# Patient Record
Sex: Male | Born: 1965 | Race: Black or African American | Hispanic: No | Marital: Single | Smoking: Current every day smoker
Health system: Southern US, Community
[De-identification: ages and names within clinical notes are randomized; demographics above are authoritative.]

## PROBLEM LIST (undated history)

## (undated) DIAGNOSIS — F39 Unspecified mood [affective] disorder: Secondary | ICD-10-CM

## (undated) DIAGNOSIS — M249 Joint derangement, unspecified: Secondary | ICD-10-CM

## (undated) DIAGNOSIS — I10 Essential (primary) hypertension: Secondary | ICD-10-CM

## (undated) HISTORY — PX: ABDOMINAL DEBRIDEMENT: SHX1109

---

## 2017-05-23 ENCOUNTER — Other Ambulatory Visit: Payer: Self-pay

## 2017-05-23 ENCOUNTER — Encounter (HOSPITAL_COMMUNITY): Payer: Self-pay | Admitting: Emergency Medicine

## 2017-05-23 ENCOUNTER — Ambulatory Visit (HOSPITAL_COMMUNITY)
Admission: EM | Admit: 2017-05-23 | Discharge: 2017-05-23 | Disposition: A | Discharge status: Home / Self Care | Payer: Medicaid Other

## 2017-05-23 DIAGNOSIS — M5441 Lumbago with sciatica, right side: Secondary | ICD-10-CM

## 2017-05-23 DIAGNOSIS — R1013 Epigastric pain: Secondary | ICD-10-CM | POA: Diagnosis not present

## 2017-05-23 DIAGNOSIS — G8929 Other chronic pain: Secondary | ICD-10-CM

## 2017-05-23 HISTORY — DX: Essential (primary) hypertension: I10

## 2017-05-23 HISTORY — DX: Joint derangement, unspecified: M24.9

## 2017-05-23 HISTORY — DX: Unspecified mood (affective) disorder: F39

## 2017-05-23 MED ORDER — OMEPRAZOLE 40 MG PO CPDR: 40.0000 mg | DELAYED_RELEASE_CAPSULE | Freq: Every day | ORAL | 0 refills | Status: AC

## 2017-05-23 MED ORDER — TIZANIDINE HCL 4 MG PO CAPS: 4.0000 mg | ORAL_CAPSULE | Freq: Three times a day (TID) | ORAL | 0 refills | Status: AC | PRN

## 2017-05-23 MED ORDER — HYOSCYAMINE SULFATE 0.125 MG PO TABS: 0.1250 mg | ORAL_TABLET | Freq: Four times a day (QID) | ORAL | 0 refills | Status: AC | PRN

## 2017-05-23 NOTE — ED Triage Notes (Signed)
C/o upper abdominal pain after eat and have a BM onset over 2 weeks. Denies N/V
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
## 2017-05-23 NOTE — Discharge Instructions (Addendum)
Recommend start Prilosec 40mg  once daily for stomach pain. May also take Levsin 1 tablet every 8 hours as needed for pain or cramps. May also use Zanaflex 4mg  tablet- 1 every 8 hours as needed for back pain/spasms. May also take Tylenol 1000mg  every 8 hours as needed for pain. Avoid fatty, acidic foods. Encouraged to decrease smoking. Recommend follow-up with a PCP within 1 week for recheck or go to the ER if pain worsens.

## 2018-07-07 ENCOUNTER — Encounter (HOSPITAL_COMMUNITY): Payer: Self-pay | Admitting: Emergency Medicine

## 2018-07-07 ENCOUNTER — Emergency Department (HOSPITAL_COMMUNITY)
Admission: EM | Admit: 2018-07-07 | Discharge: 2018-07-07 | Disposition: A | Discharge status: Home / Self Care | Payer: Medicaid Other | Attending: Emergency Medicine | Admitting: Emergency Medicine

## 2018-07-07 ENCOUNTER — Emergency Department (HOSPITAL_COMMUNITY): Payer: Medicaid Other

## 2018-07-07 DIAGNOSIS — S71111A Laceration without foreign body, right thigh, initial encounter: Secondary | ICD-10-CM | POA: Insufficient documentation

## 2018-07-07 DIAGNOSIS — I10 Essential (primary) hypertension: Secondary | ICD-10-CM | POA: Diagnosis not present

## 2018-07-07 DIAGNOSIS — S0231XA Fracture of orbital floor, right side, initial encounter for closed fracture: Secondary | ICD-10-CM | POA: Diagnosis not present

## 2018-07-07 DIAGNOSIS — M549 Dorsalgia, unspecified: Secondary | ICD-10-CM | POA: Insufficient documentation

## 2018-07-07 DIAGNOSIS — Y939 Activity, unspecified: Secondary | ICD-10-CM | POA: Diagnosis not present

## 2018-07-07 DIAGNOSIS — S0181XA Laceration without foreign body of other part of head, initial encounter: Secondary | ICD-10-CM | POA: Insufficient documentation

## 2018-07-07 DIAGNOSIS — H1131 Conjunctival hemorrhage, right eye: Secondary | ICD-10-CM | POA: Insufficient documentation

## 2018-07-07 DIAGNOSIS — Z23 Encounter for immunization: Secondary | ICD-10-CM | POA: Diagnosis not present

## 2018-07-07 DIAGNOSIS — Y929 Unspecified place or not applicable: Secondary | ICD-10-CM | POA: Insufficient documentation

## 2018-07-07 DIAGNOSIS — Y999 Unspecified external cause status: Secondary | ICD-10-CM | POA: Insufficient documentation

## 2018-07-07 DIAGNOSIS — R51 Headache: Secondary | ICD-10-CM | POA: Diagnosis not present

## 2018-07-07 DIAGNOSIS — R1084 Generalized abdominal pain: Secondary | ICD-10-CM | POA: Diagnosis not present

## 2018-07-07 DIAGNOSIS — T07XXXA Unspecified multiple injuries, initial encounter: Secondary | ICD-10-CM | POA: Diagnosis present

## 2018-07-07 DIAGNOSIS — S70311A Abrasion, right thigh, initial encounter: Secondary | ICD-10-CM | POA: Diagnosis not present

## 2018-07-07 DIAGNOSIS — F17210 Nicotine dependence, cigarettes, uncomplicated: Secondary | ICD-10-CM | POA: Insufficient documentation

## 2018-07-07 DIAGNOSIS — R0789 Other chest pain: Secondary | ICD-10-CM | POA: Diagnosis not present

## 2018-07-07 LAB — COMPREHENSIVE METABOLIC PANEL
ALT: 54 U/L — ABNORMAL HIGH (ref 0–44)
AST: 54 U/L — ABNORMAL HIGH (ref 15–41)
Albumin: 3.9 g/dL (ref 3.5–5.0)
Alkaline Phosphatase: 90 U/L (ref 38–126)
Anion gap: 12 (ref 5–15)
BUN: 14 mg/dL (ref 6–20)
CO2: 23 mmol/L (ref 22–32)
Calcium: 9.1 mg/dL (ref 8.9–10.3)
Chloride: 100 mmol/L (ref 98–111)
Creatinine, Ser: 1.24 mg/dL (ref 0.61–1.24)
GFR calc Af Amer: >60 mL/min (ref >60)
GFR calc non Af Amer: >60 mL/min (ref >60)
Glucose, Bld: 113 mg/dL — ABNORMAL HIGH (ref 70–99)
Potassium: 2.9 mmol/L — ABNORMAL LOW (ref 3.5–5.1)
Sodium: 135 mmol/L (ref 135–145)
Total Bilirubin: 0.9 mg/dL (ref 0.3–1.2)
Total Protein: 7.7 g/dL (ref 6.5–8.1)

## 2018-07-07 LAB — CBC WITH DIFFERENTIAL/PLATELET
Abs Immature Granulocytes: 0.08 10*3/uL — ABNORMAL HIGH (ref 0.00–0.07)
Basophils Absolute: 0.1 10*3/uL (ref 0.0–0.1)
Basophils Relative: 1 %
Eosinophils Absolute: 0.1 10*3/uL (ref 0.0–0.5)
Eosinophils Relative: 1 %
HCT: 45.6 % (ref 39.0–52.0)
Hemoglobin: 15.2 g/dL (ref 13.0–17.0)
Immature Granulocytes: 1 %
Lymphocytes Relative: 23 %
Lymphs Abs: 2 10*3/uL (ref 0.7–4.0)
MCH: 29.9 pg (ref 26.0–34.0)
MCHC: 33.3 g/dL (ref 30.0–36.0)
MCV: 89.8 fL (ref 80.0–100.0)
Monocytes Absolute: 0.5 10*3/uL (ref 0.1–1.0)
Monocytes Relative: 6 %
Neutro Abs: 6 10*3/uL (ref 1.7–7.7)
Neutrophils Relative %: 68 %
Platelets: 174 10*3/uL (ref 150–400)
RBC: 5.08 MIL/uL (ref 4.22–5.81)
RDW: 12.9 % (ref 11.5–15.5)
WBC: 8.8 10*3/uL (ref 4.0–10.5)
nRBC: 0 % (ref 0.0–0.2)

## 2018-07-07 LAB — URINALYSIS, ROUTINE W REFLEX MICROSCOPIC
Bacteria, UA: NONE SEEN
Bilirubin Urine: NEGATIVE
Glucose, UA: 50 mg/dL — AB
Ketones, ur: NEGATIVE mg/dL
Leukocytes,Ua: NEGATIVE
Nitrite: NEGATIVE
Protein, ur: 30 mg/dL — AB
Specific Gravity, Urine: 1.017 (ref 1.005–1.030)
pH: 7 (ref 5.0–8.0)

## 2018-07-07 MED ORDER — IOHEXOL 300 MG/ML  SOLN
100.0000 mL | Freq: Once | INTRAMUSCULAR | Status: AC | PRN
  Administered 2018-07-07: 100 mL via INTRAVENOUS

## 2018-07-07 MED ORDER — FLUORESCEIN SODIUM 1 MG OP STRP
1.0000 | ORAL_STRIP | Freq: Once | OPHTHALMIC | Status: DC
  Filled 2018-07-07: qty 1

## 2018-07-07 MED ORDER — TETRACAINE HCL 0.5 % OP SOLN
2.0000 [drp] | Freq: Once | OPHTHALMIC | Status: DC
  Filled 2018-07-07: qty 4

## 2018-07-07 MED ORDER — MORPHINE SULFATE (PF) 4 MG/ML IV SOLN
4.0000 mg | Freq: Once | INTRAVENOUS | Status: AC
  Administered 2018-07-07: 4 mg via INTRAVENOUS
  Filled 2018-07-07: qty 1

## 2018-07-07 MED ORDER — LIDOCAINE-EPINEPHRINE (PF) 2 %-1:200000 IJ SOLN
INTRAMUSCULAR | Status: AC
  Administered 2018-07-07: 20 mL
  Filled 2018-07-07: qty 20

## 2018-07-07 MED ORDER — LIDOCAINE-EPINEPHRINE (PF) 2 %-1:200000 IJ SOLN
20.0000 mL | Freq: Once | INTRAMUSCULAR | Status: AC
  Administered 2018-07-07: 20 mL
  Filled 2018-07-07: qty 20

## 2018-07-07 MED ORDER — TETANUS-DIPHTH-ACELL PERTUSSIS 5-2.5-18.5 LF-MCG/0.5 IM SUSP
0.5000 mL | Freq: Once | INTRAMUSCULAR | Status: AC
Start: 2018-07-07 — End: 2018-07-07
  Administered 2018-07-07: 0.5 mL via INTRAMUSCULAR
  Filled 2018-07-07: qty 0.5

## 2018-07-07 MED ORDER — OXYCODONE-ACETAMINOPHEN 5-325 MG PO TABS
1.0000 | ORAL_TABLET | Freq: Once | ORAL | Status: AC
  Administered 2018-07-07: 1 via ORAL
  Filled 2018-07-07: qty 1

## 2018-07-07 MED ORDER — POTASSIUM CHLORIDE CRYS ER 20 MEQ PO TBCR
40.0000 meq | EXTENDED_RELEASE_TABLET | Freq: Once | ORAL | Status: AC
  Administered 2018-07-07: 40 meq via ORAL
  Filled 2018-07-07: qty 2

## 2018-07-07 MED ORDER — OXYCODONE-ACETAMINOPHEN 5-325 MG PO TABS: 2.0000 | ORAL_TABLET | Freq: Four times a day (QID) | ORAL | 0 refills | Status: AC | PRN
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
## 2018-07-07 NOTE — ED Notes (Signed)
Moved to Room 18, Report given to [place], [place]

## 2018-07-07 NOTE — ED Triage Notes (Signed)
Pt has swollen rt. Eye laceration above rt. Eye. Laceration across left forehead.
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
## 2018-07-07 NOTE — ED Notes (Signed)
Vision acuity test completed: 20/25 booth eyes. 20/50 with right eye(left eye covered). 20/20 with left eye(right eye covered).

## 2018-07-07 NOTE — ED Notes (Signed)
Patient transported to CT 

## 2018-08-09 ENCOUNTER — Encounter (HOSPITAL_COMMUNITY): Payer: Self-pay | Admitting: Emergency Medicine

## 2018-08-09 ENCOUNTER — Other Ambulatory Visit: Payer: Self-pay

## 2018-08-09 ENCOUNTER — Emergency Department (HOSPITAL_COMMUNITY)
Admission: EM | Admit: 2018-08-09 | Discharge: 2018-08-09 | Disposition: A | Discharge status: Home / Self Care | Payer: Medicaid Other | Attending: Emergency Medicine | Admitting: Emergency Medicine

## 2018-08-09 DIAGNOSIS — Z4802 Encounter for removal of sutures: Secondary | ICD-10-CM | POA: Diagnosis present

## 2018-08-09 DIAGNOSIS — Z590 Homelessness: Secondary | ICD-10-CM | POA: Insufficient documentation

## 2018-08-09 DIAGNOSIS — G8921 Chronic pain due to trauma: Secondary | ICD-10-CM | POA: Diagnosis not present

## 2018-08-09 DIAGNOSIS — F1290 Cannabis use, unspecified, uncomplicated: Secondary | ICD-10-CM | POA: Insufficient documentation

## 2018-08-09 DIAGNOSIS — F17210 Nicotine dependence, cigarettes, uncomplicated: Secondary | ICD-10-CM | POA: Diagnosis not present

## 2018-08-09 DIAGNOSIS — F329 Major depressive disorder, single episode, unspecified: Secondary | ICD-10-CM | POA: Diagnosis not present

## 2018-08-09 DIAGNOSIS — Z20828 Contact with and (suspected) exposure to other viral communicable diseases: Secondary | ICD-10-CM | POA: Insufficient documentation

## 2018-08-09 DIAGNOSIS — Z008 Encounter for other general examination: Secondary | ICD-10-CM

## 2018-08-09 LAB — COMPREHENSIVE METABOLIC PANEL
ALT: 47 U/L — ABNORMAL HIGH (ref 0–44)
AST: 42 U/L — ABNORMAL HIGH (ref 15–41)
Albumin: 3.8 g/dL (ref 3.5–5.0)
Alkaline Phosphatase: 91 U/L (ref 38–126)
Anion gap: 11 (ref 5–15)
BUN: 17 mg/dL (ref 6–20)
CO2: 23 mmol/L (ref 22–32)
Calcium: 9.2 mg/dL (ref 8.9–10.3)
Chloride: 105 mmol/L (ref 98–111)
Creatinine, Ser: 1.09 mg/dL (ref 0.61–1.24)
GFR calc Af Amer: >60 mL/min (ref >60)
GFR calc non Af Amer: >60 mL/min (ref >60)
Glucose, Bld: 103 mg/dL — ABNORMAL HIGH (ref 70–99)
Potassium: 3.1 mmol/L — ABNORMAL LOW (ref 3.5–5.1)
Sodium: 139 mmol/L (ref 135–145)
Total Bilirubin: 0.6 mg/dL (ref 0.3–1.2)
Total Protein: 7.8 g/dL (ref 6.5–8.1)

## 2018-08-09 LAB — CBC
HCT: 43.2 % (ref 39.0–52.0)
Hemoglobin: 14.3 g/dL (ref 13.0–17.0)
MCH: 29.9 pg (ref 26.0–34.0)
MCHC: 33.1 g/dL (ref 30.0–36.0)
MCV: 90.2 fL (ref 80.0–100.0)
Platelets: 151 10*3/uL (ref 150–400)
RBC: 4.79 MIL/uL (ref 4.22–5.81)
RDW: 12.9 % (ref 11.5–15.5)
WBC: 6.4 10*3/uL (ref 4.0–10.5)
nRBC: 0 % (ref 0.0–0.2)

## 2018-08-09 LAB — RAPID URINE DRUG SCREEN, HOSP PERFORMED
Amphetamines: NOT DETECTED
Barbiturates: NOT DETECTED
Benzodiazepines: NOT DETECTED
Cocaine: NOT DETECTED
Opiates: NOT DETECTED
Tetrahydrocannabinol: POSITIVE — AB

## 2018-08-09 LAB — ETHANOL: Alcohol, Ethyl (B): <10 mg/dL (ref <10)

## 2018-08-09 LAB — SALICYLATE LEVEL: Salicylate Lvl: <7 mg/dL (ref 2.8–30.0)

## 2018-08-09 LAB — ACETAMINOPHEN LEVEL: Acetaminophen (Tylenol), Serum: <10 ug/mL — ABNORMAL LOW (ref 10–30)

## 2018-08-09 MED ORDER — CLONIDINE HCL 0.2 MG PO TABS
0.1000 mg | ORAL_TABLET | Freq: Once | ORAL | Status: AC
  Administered 2018-08-09: 21:00:00 0.1 mg via ORAL
  Filled 2018-08-09: qty 1

## 2018-08-09 MED ORDER — POTASSIUM CHLORIDE CRYS ER 20 MEQ PO TBCR
20.0000 meq | EXTENDED_RELEASE_TABLET | Freq: Every day | ORAL | Status: DC
  Filled 2018-08-09: qty 1

## 2018-08-09 MED ORDER — ACETAMINOPHEN 325 MG PO TABS
650.0000 mg | ORAL_TABLET | Freq: Once | ORAL | Status: AC
  Administered 2018-08-09: 650 mg via ORAL
  Filled 2018-08-09: qty 2

## 2018-08-09 MED ORDER — POTASSIUM CHLORIDE CRYS ER 20 MEQ PO TBCR
40.0000 meq | EXTENDED_RELEASE_TABLET | Freq: Once | ORAL | Status: AC
  Administered 2018-08-09: 15:00:00 40 meq via ORAL
  Filled 2018-08-09: qty 2
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
## 2018-08-09 NOTE — Progress Notes (Signed)
CSW faxed over information/resources for [facility].   Chalmers Guest. Guerry Bruin, MSW, [place] Work/Disposition Phone: [phone] Fax: [phone]
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
## 2018-08-09 NOTE — ED Notes (Signed)
Personal belongings sheet completed 1 duffle bag placed in locker #2. Patient signed What should I expect sheet.
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
## 2018-08-09 NOTE — ED Notes (Signed)
PA contacted because patient told nurse he feels suicidal now that he is at the breaking point.
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
## 2018-08-10 ENCOUNTER — Other Ambulatory Visit: Payer: Self-pay

## 2018-08-10 ENCOUNTER — Emergency Department (HOSPITAL_COMMUNITY)
Admission: EM | Admit: 2018-08-10 | Discharge: 2018-08-10 | Disposition: A | Discharge status: Home / Self Care | Payer: Medicaid Other | Attending: Emergency Medicine | Admitting: Emergency Medicine

## 2018-08-10 ENCOUNTER — Encounter (HOSPITAL_COMMUNITY): Payer: Self-pay | Admitting: *Deleted

## 2018-08-10 DIAGNOSIS — M545 Low back pain: Secondary | ICD-10-CM | POA: Insufficient documentation

## 2018-08-10 DIAGNOSIS — F329 Major depressive disorder, single episode, unspecified: Secondary | ICD-10-CM | POA: Diagnosis present

## 2018-08-10 DIAGNOSIS — Z008 Encounter for other general examination: Secondary | ICD-10-CM | POA: Diagnosis present

## 2018-08-10 DIAGNOSIS — Z79899 Other long term (current) drug therapy: Secondary | ICD-10-CM | POA: Insufficient documentation

## 2018-08-10 DIAGNOSIS — Z590 Homelessness: Secondary | ICD-10-CM | POA: Insufficient documentation

## 2018-08-10 DIAGNOSIS — Z03818 Encounter for observation for suspected exposure to other biological agents ruled out: Secondary | ICD-10-CM | POA: Diagnosis not present

## 2018-08-10 DIAGNOSIS — F331 Major depressive disorder, recurrent, moderate: Secondary | ICD-10-CM

## 2018-08-10 DIAGNOSIS — F17200 Nicotine dependence, unspecified, uncomplicated: Secondary | ICD-10-CM | POA: Insufficient documentation

## 2018-08-10 DIAGNOSIS — G501 Atypical facial pain: Secondary | ICD-10-CM | POA: Insufficient documentation

## 2018-08-10 DIAGNOSIS — Z9119 Patient's noncompliance with other medical treatment and regimen: Secondary | ICD-10-CM | POA: Insufficient documentation

## 2018-08-10 DIAGNOSIS — R45851 Suicidal ideations: Secondary | ICD-10-CM | POA: Diagnosis not present

## 2018-08-10 DIAGNOSIS — F322 Major depressive disorder, single episode, severe without psychotic features: Secondary | ICD-10-CM | POA: Diagnosis not present

## 2018-08-10 DIAGNOSIS — I10 Essential (primary) hypertension: Secondary | ICD-10-CM | POA: Insufficient documentation

## 2018-08-10 LAB — COMPREHENSIVE METABOLIC PANEL
ALT: 52 U/L — ABNORMAL HIGH (ref 0–44)
AST: 47 U/L — ABNORMAL HIGH (ref 15–41)
Albumin: 4.2 g/dL (ref 3.5–5.0)
Alkaline Phosphatase: 104 U/L (ref 38–126)
Anion gap: 12 (ref 5–15)
BUN: 16 mg/dL (ref 6–20)
CO2: 27 mmol/L (ref 22–32)
Calcium: 9.9 mg/dL (ref 8.9–10.3)
Chloride: 99 mmol/L (ref 98–111)
Creatinine, Ser: 1.19 mg/dL (ref 0.61–1.24)
GFR calc Af Amer: >60 mL/min (ref >60)
GFR calc non Af Amer: >60 mL/min (ref >60)
Glucose, Bld: 100 mg/dL — ABNORMAL HIGH (ref 70–99)
Potassium: 3.2 mmol/L — ABNORMAL LOW (ref 3.5–5.1)
Sodium: 138 mmol/L (ref 135–145)
Total Bilirubin: 1.1 mg/dL (ref 0.3–1.2)
Total Protein: 8.4 g/dL — ABNORMAL HIGH (ref 6.5–8.1)

## 2018-08-10 LAB — RAPID URINE DRUG SCREEN, HOSP PERFORMED
Amphetamines: NOT DETECTED
Barbiturates: NOT DETECTED
Benzodiazepines: NOT DETECTED
Cocaine: NOT DETECTED
Opiates: NOT DETECTED
Tetrahydrocannabinol: POSITIVE — AB

## 2018-08-10 LAB — CBC
HCT: 45.8 % (ref 39.0–52.0)
Hemoglobin: 15 g/dL (ref 13.0–17.0)
MCH: 29.6 pg (ref 26.0–34.0)
MCHC: 32.8 g/dL (ref 30.0–36.0)
MCV: 90.5 fL (ref 80.0–100.0)
Platelets: 153 10*3/uL (ref 150–400)
RBC: 5.06 MIL/uL (ref 4.22–5.81)
RDW: 13.1 % (ref 11.5–15.5)
WBC: 6.8 10*3/uL (ref 4.0–10.5)
nRBC: 0 % (ref 0.0–0.2)

## 2018-08-10 LAB — ETHANOL: Alcohol, Ethyl (B): <10 mg/dL (ref <10)

## 2018-08-10 LAB — SARS CORONAVIRUS 2: SARS Coronavirus 2: NOT DETECTED

## 2018-08-10 MED ORDER — ONDANSETRON HCL 4 MG PO TABS: 4.0000 mg | ORAL_TABLET | Freq: Three times a day (TID) | ORAL | Status: DC | PRN

## 2018-08-10 MED ORDER — PANTOPRAZOLE SODIUM 40 MG PO TBEC
80.0000 mg | DELAYED_RELEASE_TABLET | Freq: Every day | ORAL | Status: DC
  Administered 2018-08-10: 10:00:00 80 mg via ORAL
  Filled 2018-08-10: qty 2

## 2018-08-10 MED ORDER — PROPRANOLOL HCL 40 MG PO TABS
40.0000 mg | ORAL_TABLET | Freq: Three times a day (TID) | ORAL | Status: DC
  Administered 2018-08-10: 40 mg via ORAL
  Filled 2018-08-10: qty 1

## 2018-08-10 MED ORDER — ZOLPIDEM TARTRATE 5 MG PO TABS: 5.0000 mg | ORAL_TABLET | Freq: Every evening | ORAL | Status: DC | PRN

## 2018-08-10 MED ORDER — CLONIDINE HCL 0.2 MG PO TABS: 0.2000 mg | ORAL_TABLET | Freq: Two times a day (BID) | ORAL | 0 refills | Status: AC

## 2018-08-10 MED ORDER — HYDROCHLOROTHIAZIDE 12.5 MG PO CAPS
12.5000 mg | ORAL_CAPSULE | Freq: Every day | ORAL | Status: DC
  Administered 2018-08-10: 12.5 mg via ORAL
  Filled 2018-08-10 (×2): qty 1

## 2018-08-10 MED ORDER — CLONIDINE HCL 0.2 MG PO TABS
0.2000 mg | ORAL_TABLET | Freq: Two times a day (BID) | ORAL | Status: DC
  Administered 2018-08-10 (×2): 0.2 mg via ORAL
  Filled 2018-08-10 (×2): qty 1

## 2018-08-10 MED ORDER — ALUM & MAG HYDROXIDE-SIMETH 200-200-20 MG/5ML PO SUSP: 30.0000 mL | Freq: Four times a day (QID) | ORAL | Status: DC | PRN

## 2018-08-10 MED ORDER — PROPRANOLOL HCL 40 MG PO TABS: 40.0000 mg | ORAL_TABLET | Freq: Three times a day (TID) | ORAL | 0 refills | Status: AC

## 2018-08-10 MED ORDER — LITHIUM CARBONATE 150 MG PO CAPS
150.0000 mg | ORAL_CAPSULE | Freq: Three times a day (TID) | ORAL | Status: DC
  Administered 2018-08-10 (×2): 150 mg via ORAL
  Filled 2018-08-10 (×5): qty 1

## 2018-08-10 MED ORDER — HYOSCYAMINE SULFATE 0.125 MG SL SUBL: 0.1250 mg | SUBLINGUAL_TABLET | Freq: Four times a day (QID) | SUBLINGUAL | Status: DC | PRN

## 2018-08-10 MED ORDER — TIZANIDINE HCL 4 MG PO TABS: 4.0000 mg | ORAL_TABLET | Freq: Three times a day (TID) | ORAL | Status: DC | PRN

## 2018-08-10 MED ORDER — HYDROCODONE-ACETAMINOPHEN 5-325 MG PO TABS
1.0000 | ORAL_TABLET | ORAL | Status: DC | PRN
  Administered 2018-08-10 (×2): 1 via ORAL
  Filled 2018-08-10 (×2): qty 1

## 2018-08-10 MED ORDER — HYDROCHLOROTHIAZIDE 12.5 MG PO CAPS: 12.5000 mg | ORAL_CAPSULE | Freq: Every day | ORAL | 0 refills | Status: AC

## 2018-08-10 MED ORDER — NICOTINE 21 MG/24HR TD PT24
21.0000 mg | MEDICATED_PATCH | Freq: Every day | TRANSDERMAL | Status: DC
  Administered 2018-08-10: 21 mg via TRANSDERMAL
  Filled 2018-08-10: qty 1

## 2018-08-10 NOTE — ED Provider Notes (Addendum)
Pt in the dept for worsening depression. initially felt safe for d/c by [facility] team last night and d/c. Pt then checked back in and told provider he wanted to end it all. As such, tts reevaluated the pt, but no plan for care was pu tin the note. Will have RN contact tts to ask whether pt will be d/c or if they're looking for placement.   Pt remains hypertensive, h/o htn. Has not yet started his home meds, but they have been ordered by previous provider.      Franchot Heidelberg, PA-C 08/10/18 0848  1100: received call from [place], NP from [facility] who states pt said he is no longer SI. Pt states he is not SI because he was allowed to stay overnight. pt states he wants to stay in the hospital for shelter. Per [facility], pt is safe for d/c without OP resources.  Recommends starting pt on home meds (cautiously. As such, will start on clonidine, hctz, propranolol. As pt does not have a [facility] provider at this time, will hold on lithium as I do not believe he can have this monitored appropriately. If/when he follows up with [facility] outpatient, he can be started on lithium again.    Franchot Heidelberg, PA-C 08/10/18 1138    Pattricia Boss, MD 08/10/18 1343

## 2018-08-10 NOTE — ED Triage Notes (Signed)
Pt reports he wants to "check out and not live anymore". Has been "thinking about it for a long time and the thoughts have been coming more and more". Denies specific plan. Reports he does smoke cigarettes and smoked a blount a couple of days ago.
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
## 2018-08-10 NOTE — ED Notes (Signed)
Pts belongings placed in floor in front of cabinet 9. Bag was too big to fit in cabinet.

## 2018-08-10 NOTE — ED Notes (Signed)
Pt changed into scrubs, wanded by security. Belongings removed.

## 2018-08-10 NOTE — Discharge Instructions (Signed)
Take home medications as prescribed. It is very important that you follow up with both [place] and wellness as well as with the resources given to you by the behavioral health team.

## 2018-08-10 NOTE — ED Notes (Signed)
ED Provider at bedside. 
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
## 2018-08-10 NOTE — Progress Notes (Signed)
CSW faxed over resources/information on [place], [facility] [place], [place] and [employer], and a list of low/no-cost medical clinics in the [place], [place] and surrounding areas.   Chalmers Guest. Guerry Bruin, MSW, [place] Work/Disposition Phone: [phone] Fax: [phone]
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
## 2018-08-11 LAB — NOVEL CORONAVIRUS, NAA (HOSP ORDER, SEND-OUT TO REF LAB; TAT 18-24 HRS): SARS-CoV-2, NAA: NOT DETECTED

## 2018-08-18 ENCOUNTER — Emergency Department (HOSPITAL_COMMUNITY)
Admission: EM | Admit: 2018-08-18 | Discharge: 2018-08-18 | Disposition: A | Discharge status: Home / Self Care | Payer: Medicaid Other | Attending: Emergency Medicine | Admitting: Emergency Medicine

## 2018-08-18 ENCOUNTER — Encounter (HOSPITAL_COMMUNITY): Payer: Self-pay

## 2018-08-18 ENCOUNTER — Other Ambulatory Visit: Payer: Self-pay

## 2018-08-18 DIAGNOSIS — F17200 Nicotine dependence, unspecified, uncomplicated: Secondary | ICD-10-CM | POA: Insufficient documentation

## 2018-08-18 DIAGNOSIS — I10 Essential (primary) hypertension: Secondary | ICD-10-CM | POA: Insufficient documentation

## 2018-08-18 DIAGNOSIS — R45851 Suicidal ideations: Secondary | ICD-10-CM | POA: Insufficient documentation

## 2018-08-18 DIAGNOSIS — Z79899 Other long term (current) drug therapy: Secondary | ICD-10-CM | POA: Diagnosis not present

## 2018-08-18 LAB — CBC WITH DIFFERENTIAL/PLATELET
Abs Immature Granulocytes: 0.02 10*3/uL (ref 0.00–0.07)
Basophils Absolute: 0.1 10*3/uL (ref 0.0–0.1)
Basophils Relative: 1 %
Eosinophils Absolute: 0.1 10*3/uL (ref 0.0–0.5)
Eosinophils Relative: 2 %
HCT: 43.3 % (ref 39.0–52.0)
Hemoglobin: 14.3 g/dL (ref 13.0–17.0)
Immature Granulocytes: 0 %
Lymphocytes Relative: 60 %
Lymphs Abs: 4.1 10*3/uL — ABNORMAL HIGH (ref 0.7–4.0)
MCH: 29.9 pg (ref 26.0–34.0)
MCHC: 33 g/dL (ref 30.0–36.0)
MCV: 90.4 fL (ref 80.0–100.0)
Monocytes Absolute: 0.5 10*3/uL (ref 0.1–1.0)
Monocytes Relative: 7 %
Neutro Abs: 2 10*3/uL (ref 1.7–7.7)
Neutrophils Relative %: 30 %
Platelets: 155 10*3/uL (ref 150–400)
RBC: 4.79 MIL/uL (ref 4.22–5.81)
RDW: 12.7 % (ref 11.5–15.5)
WBC: 6.9 10*3/uL (ref 4.0–10.5)
nRBC: 0 % (ref 0.0–0.2)

## 2018-08-18 LAB — URINALYSIS, ROUTINE W REFLEX MICROSCOPIC
Bilirubin Urine: NEGATIVE
Glucose, UA: 50 mg/dL — AB
Hgb urine dipstick: NEGATIVE
Ketones, ur: NEGATIVE mg/dL
Leukocytes,Ua: NEGATIVE
Nitrite: NEGATIVE
Protein, ur: NEGATIVE mg/dL
Specific Gravity, Urine: 1.011 (ref 1.005–1.030)
pH: 7 (ref 5.0–8.0)

## 2018-08-18 LAB — COMPREHENSIVE METABOLIC PANEL
ALT: 41 U/L (ref 0–44)
AST: 40 U/L (ref 15–41)
Albumin: 4.1 g/dL (ref 3.5–5.0)
Alkaline Phosphatase: 100 U/L (ref 38–126)
Anion gap: 10 (ref 5–15)
BUN: 17 mg/dL (ref 6–20)
CO2: 26 mmol/L (ref 22–32)
Calcium: 9.2 mg/dL (ref 8.9–10.3)
Chloride: 105 mmol/L (ref 98–111)
Creatinine, Ser: 1.11 mg/dL (ref 0.61–1.24)
GFR calc Af Amer: >60 mL/min (ref >60)
GFR calc non Af Amer: >60 mL/min (ref >60)
Glucose, Bld: 90 mg/dL (ref 70–99)
Potassium: 2.9 mmol/L — ABNORMAL LOW (ref 3.5–5.1)
Sodium: 141 mmol/L (ref 135–145)
Total Bilirubin: 0.7 mg/dL (ref 0.3–1.2)
Total Protein: 8 g/dL (ref 6.5–8.1)

## 2018-08-18 LAB — RAPID URINE DRUG SCREEN, HOSP PERFORMED
Amphetamines: NOT DETECTED
Barbiturates: NOT DETECTED
Benzodiazepines: NOT DETECTED
Cocaine: NOT DETECTED
Opiates: NOT DETECTED
Tetrahydrocannabinol: POSITIVE — AB

## 2018-08-18 LAB — ETHANOL: Alcohol, Ethyl (B): <10 mg/dL (ref <10)

## 2018-08-18 MED ORDER — POTASSIUM CHLORIDE ER 10 MEQ PO TBCR: 10.0000 meq | EXTENDED_RELEASE_TABLET | Freq: Two times a day (BID) | ORAL | 0 refills | Status: AC

## 2018-08-18 MED ORDER — PROPRANOLOL HCL 40 MG PO TABS
40.0000 mg | ORAL_TABLET | Freq: Three times a day (TID) | ORAL | Status: DC
  Administered 2018-08-18: 40 mg via ORAL
  Filled 2018-08-18 (×3): qty 1

## 2018-08-18 MED ORDER — PANTOPRAZOLE SODIUM 40 MG PO TBEC
40.0000 mg | DELAYED_RELEASE_TABLET | Freq: Every day | ORAL | Status: DC
  Administered 2018-08-18: 18:00:00 40 mg via ORAL
  Filled 2018-08-18: qty 1

## 2018-08-18 MED ORDER — HYDROCHLOROTHIAZIDE 12.5 MG PO CAPS
12.5000 mg | ORAL_CAPSULE | Freq: Every day | ORAL | Status: DC
  Administered 2018-08-18: 12.5 mg via ORAL
  Filled 2018-08-18: qty 1

## 2018-08-18 MED ORDER — POTASSIUM CHLORIDE CRYS ER 20 MEQ PO TBCR
40.0000 meq | EXTENDED_RELEASE_TABLET | Freq: Once | ORAL | Status: AC
  Administered 2018-08-18: 20:00:00 40 meq via ORAL
  Filled 2018-08-18: qty 2

## 2018-08-18 MED ORDER — CLONIDINE HCL 0.1 MG PO TABS
0.2000 mg | ORAL_TABLET | Freq: Two times a day (BID) | ORAL | Status: DC
  Administered 2018-08-18: 0.2 mg via ORAL
  Filled 2018-08-18: qty 2

## 2018-08-18 NOTE — ED Notes (Signed)
Discharge instructions given ,no further questions at this time.

## 2018-08-18 NOTE — ED Notes (Signed)
Per Kennith Gain at [place], transportation will be provided for pt to be taken back to [facility].
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
## 2018-08-18 NOTE — ED Triage Notes (Signed)
Pt BIB EMS from [facility] for SI. Pt providers c/o hypertension and hypokalemia. Pt to return to [facility] after medical clearance.   198/142 HR 74 Resp 18 98% RA  CBG 107 Temp 98.1

## 2018-08-18 NOTE — Discharge Instructions (Addendum)
Cont BP meds. Take potassium as prescribed.

## 2018-08-18 NOTE — ED Notes (Signed)
Bed: JJ00 Expected date:  Expected time:  Means of arrival:  Comments: Evs-airout

## 2018-08-18 NOTE — BH Assessment (Signed)
[place] Assessment Progress Note    TTS unable to see patient at this time due to multiple assessments at one time
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
## 2021-05-02 ENCOUNTER — Emergency Department (HOSPITAL_COMMUNITY)
Admission: EM | Admit: 2021-05-02 | Discharge: 2021-05-02 | Disposition: A | Discharge status: Home / Self Care | Payer: Medicaid Other | Attending: Emergency Medicine | Admitting: Emergency Medicine

## 2021-05-02 ENCOUNTER — Encounter (HOSPITAL_COMMUNITY): Payer: Self-pay | Admitting: Emergency Medicine

## 2021-05-02 DIAGNOSIS — R001 Bradycardia, unspecified: Secondary | ICD-10-CM | POA: Insufficient documentation

## 2021-05-02 DIAGNOSIS — Z79899 Other long term (current) drug therapy: Secondary | ICD-10-CM | POA: Diagnosis not present

## 2021-05-02 DIAGNOSIS — M5442 Lumbago with sciatica, left side: Secondary | ICD-10-CM | POA: Insufficient documentation

## 2021-05-02 DIAGNOSIS — M545 Low back pain, unspecified: Secondary | ICD-10-CM | POA: Diagnosis present

## 2021-05-02 DIAGNOSIS — I10 Essential (primary) hypertension: Secondary | ICD-10-CM | POA: Insufficient documentation

## 2021-05-02 MED ORDER — LIDOCAINE 5 % EX PTCH
1.0000 | MEDICATED_PATCH | Freq: Once | CUTANEOUS | Status: DC
  Administered 2021-05-02: 1 via TRANSDERMAL
  Filled 2021-05-02: qty 1

## 2021-05-02 MED ORDER — NAPROXEN 500 MG PO TABS: 500.0000 mg | ORAL_TABLET | Freq: Two times a day (BID) | ORAL | 0 refills | Status: AC

## 2021-05-02 MED ORDER — PREDNISONE 20 MG PO TABS
60.0000 mg | ORAL_TABLET | Freq: Once | ORAL | Status: AC
  Administered 2021-05-02: 60 mg via ORAL
  Filled 2021-05-02: qty 3

## 2021-05-02 MED ORDER — METHOCARBAMOL 500 MG PO TABS: 500.0000 mg | ORAL_TABLET | Freq: Two times a day (BID) | ORAL | 0 refills | Status: AC

## 2021-05-02 MED ORDER — KETOROLAC TROMETHAMINE 30 MG/ML IJ SOLN
15.0000 mg | Freq: Once | INTRAMUSCULAR | Status: AC
  Administered 2021-05-02: 15 mg via INTRAMUSCULAR
  Filled 2021-05-02: qty 1

## 2021-05-02 MED ORDER — METHOCARBAMOL 500 MG PO TABS
500.0000 mg | ORAL_TABLET | Freq: Once | ORAL | Status: AC
  Administered 2021-05-02: 500 mg via ORAL
  Filled 2021-05-02 (×2): qty 1

## 2021-05-02 MED ORDER — OXYCODONE-ACETAMINOPHEN 5-325 MG PO TABS
1.0000 | ORAL_TABLET | Freq: Once | ORAL | Status: AC
  Administered 2021-05-02: 1 via ORAL
  Filled 2021-05-02 (×2): qty 1

## 2021-05-02 MED ORDER — METHYLPREDNISOLONE 4 MG PO TBPK: ORAL_TABLET | ORAL | 0 refills | Status: AC

## 2021-05-02 NOTE — ED Provider Notes (Signed)
[facility] EMERGENCY DEPARTMENT Provider Note   CSN: [id number] Arrival date & time: 05/02/21  1415     History  Chief Complaint  Patient presents with   Leg Pain   Hypertension    Brian Foster is a 56 y.o. male.  Brian Foster is a 56 y.o. male  with a history of sciatica, hypertension, and MDD who presents to the ED for complaints of low back and left lower extremity pain and high blood pressure. Patient states his lower back pain started on Friday, describing it as sharp and shooting down his left buttocks and leg. He states the pain is similar to his previous sciatica flares. Patient has tried "all OTC pain medications" including Goody's powder, Tylenol, and Advil which did not alleviate the pain. Pain is worsened with movement. Patient claims he decided to stay in bed in hopes this would improve his pain. Eventually, he noticed he was unable to ambulate at all and felt numbness of his left lower extremity which prompted his visit to the ED today. He denies any recent trauma or illness, saddleback numbness, headache, blurry vision, palpitations, chest pain, shortness of breath, abdominal pain, urinary/bowel incontinence, suicidal ideation. Patient admits to lightheadedness, which he attributes to lack of eating due to his difficulty ambulating. Patient claims his blood pressure is constantly high despite his compliance with his hypertension medications. He denies any alcohol or drug use, admits to tobacco use.   The history is provided by the patient.  Hypertension Pertinent negatives include no chest pain, no abdominal pain and no shortness of breath.      Home Medications Prior to Admission medications   Medication Sig Start Date End Date Taking? Authorizing Provider  methocarbamol (ROBAXIN) 500 MG tablet Take 1 tablet (500 mg total) by mouth 2 (two) times daily. 05/02/21  Yes Dartha Lodge, PA-C  methylPREDNISolone (MEDROL DOSEPAK) 4 MG TBPK tablet Take as  directed 05/02/21  Yes Dartha Lodge, PA-C  naproxen (NAPROSYN) 500 MG tablet Take 1 tablet (500 mg total) by mouth 2 (two) times daily. 05/02/21  Yes Dartha Lodge, PA-C  cloNIDine (CATAPRES) 0.2 MG tablet Take 1 tablet (0.2 mg total) by mouth 2 (two) times daily. 08/10/18   Caccavale, Sophia, PA-C  hydrochlorothiazide (MICROZIDE) 12.5 MG capsule Take 1 capsule (12.5 mg total) by mouth daily. 08/10/18   Caccavale, Sophia, PA-C  hyoscyamine (LEVSIN, ANASPAZ) 0.125 MG tablet Take 1 tablet (0.125 mg total) by mouth every 6 (six) hours as needed for cramping. 05/23/17   Amyot, Ali Lowe, NP  lithium carbonate 150 MG capsule Take 150 mg by mouth 3 (three) times daily with meals.    [provider]  omeprazole (PRILOSEC) 40 MG capsule Take 1 capsule (40 mg total) by mouth daily. 05/23/17   Sudie Grumbling, NP  oxyCODONE-acetaminophen (PERCOCET/ROXICET) 5-325 MG tablet Take 2 tablets by mouth every 6 (six) hours as needed for severe pain. 07/07/18   Terrilee Files, MD  potassium chloride (K-DUR) 10 MEQ tablet Take 1 tablet (10 mEq total) by mouth 2 (two) times daily for 10 days. 08/18/18 08/28/18  Curatolo, Adam, DO  propranolol (INDERAL) 40 MG tablet Take 1 tablet (40 mg total) by mouth 3 (three) times daily for 30 days. 08/10/18 09/09/18  Caccavale, Sophia, PA-C  tiZANidine (ZANAFLEX) 4 MG capsule Take 1 capsule (4 mg total) by mouth 3 (three) times daily as needed for muscle spasms. 05/23/17   Sudie Grumbling, NP  Allergies    Patient has no known allergies.    Review of Systems   Review of Systems  Constitutional:  Negative for chills and fever.  HENT: Negative.    Respiratory:  Negative for shortness of breath.   Cardiovascular:  Negative for chest pain.  Gastrointestinal:  Negative for abdominal pain, constipation, diarrhea, nausea and vomiting.  Genitourinary:  Negative for dysuria, flank pain, frequency and hematuria.  Musculoskeletal:  Positive for back pain. Negative for arthralgias,  gait problem, joint swelling, myalgias and neck pain.  Skin:  Negative for color change, rash and wound.  Neurological:  Negative for weakness and numbness.   Physical Exam Updated Vital Signs BP (!) 158/104    Pulse (!) 50    Temp 97.7 F (36.5 C) (Oral)    Resp 16    SpO2 100%  Physical Exam Vitals and nursing note reviewed.  Constitutional:      General: He is not in acute distress.    Appearance: He is well-developed. He is not diaphoretic.  HENT:     Head: Atraumatic.  Eyes:     General:        Right eye: No discharge.        Left eye: No discharge.  Cardiovascular:     Pulses:          Radial pulses are 2+ on the right side and 2+ on the left side.       Dorsalis pedis pulses are 2+ on the right side and 2+ on the left side.       Posterior tibial pulses are 2+ on the right side and 2+ on the left side.  Pulmonary:     Effort: Pulmonary effort is normal. No respiratory distress.  Abdominal:     General: Bowel sounds are normal. There is no distension.     Palpations: Abdomen is soft. There is no mass.     Tenderness: There is no abdominal tenderness. There is no guarding.     Comments: Abdomen soft, nondistended, nontender to palpation in all quadrants without guarding or peritoneal signs, no CVA tenderness bilaterally  Musculoskeletal:     Cervical back: Neck supple.     Comments: Tenderness to palpation over left low back, no midline spinal tenderness.  Pain made worse with range of motion of the lower extremities, positive straight leg raise on the left.  Skin:    General: Skin is warm and dry.     Capillary Refill: Capillary refill takes less than 2 seconds.  Neurological:     Mental Status: He is alert and oriented to person, place, and time.     Comments: Alert, clear speech, following commands. Moving all extremities without difficulty. Bilateral lower extremities with 5/5 strength in proximal and distal muscle groups and with dorsi and plantar  flexion. Sensation intact in bilateral lower extremities. 2+ patellar DTRs bilaterally. Ambulatory with steady gait  Psychiatric:        Behavior: Behavior normal.    ED Results / Procedures / Treatments   Labs (all labs ordered are listed, but only abnormal results are displayed) Labs Reviewed - No data to display  EKG None  Radiology No results found.  Procedures Procedures    Medications Ordered in ED Medications  lidocaine (LIDODERM) 5 % 1 patch (1 patch Transdermal Patch Applied 05/02/21 2104)  oxyCODONE-acetaminophen (PERCOCET/ROXICET) 5-325 MG per tablet 1 tablet (1 tablet Oral Given 05/02/21 2107)  ketorolac (TORADOL) 30 MG/ML injection 15 mg (15 mg Intramuscular  Given 05/02/21 2108)  predniSONE (DELTASONE) tablet 60 mg (60 mg Oral Given 05/02/21 2059)  methocarbamol (ROBAXIN) tablet 500 mg (500 mg Oral Given 05/02/21 2107)    ED Course/ Medical Decision Making/ A&P                           This patient presents to the ED for concern of back pain, this involves an extensive number of treatment options, and is a complaint that carries with it a high risk of complications and morbidity.  The differential diagnosis includes sciatica, muscle spasm, herniated disc. No back pain red flags on history or physical. Presentation not consistent with malignancy (lack of history of malignancy, lack of B symptoms), fracture (no trauma, no bony tenderness to palpation), cauda equina (no bowel or urinary incontinence/retention, no saddle anesthesia, no distal weakness), AAA, viscus perforation, osteomyelitis or epidural abscess (no IVDU, vertebral tenderness), renal colic, pyelonephritis (afebrile, no CVAT, no urinary symptoms). Given the clinical picture, no indication for imaging at this time.  Co morbidities that complicate the patient evaluation  hypertension   Additional history obtained:  External records from outside source obtained and reviewed including prior ED  visits   Cardiac Monitoring:  The patient was maintained on a cardiac monitor.  I personally viewed and interpreted the cardiac monitored which showed an underlying rhythm of: Sinus bradycardia, chronic for pt   Medicines ordered and prescription drug management:  I ordered medication including Percocet, Toradol, Robaxin, prednisone and lidoderm for back pain  Reevaluation of the patient after these medicines showed that the patient improved I have reviewed the patients home medicines and have made adjustments as needed   Critical Interventions:  Pain management   Problem List / ED Course:  Patient with back pain.  No neurological deficits and normal neuro exam.  Patient can walk but states is painful.  No loss of bowel or bladder control.  No concern for cauda equina.  No fever, night sweats, weight loss, h/o cancer, IVDU.  RICE protocol and pain medicine indicated and discussed with patient.          Final Clinical Impression(s) / ED Diagnoses Final diagnoses:  Acute left-sided low back pain with left-sided sciatica    Rx / DC Orders ED Discharge Orders          Ordered    naproxen (NAPROSYN) 500 MG tablet  2 times daily        05/02/21 2248    methocarbamol (ROBAXIN) 500 MG tablet  2 times daily        05/02/21 2248    methylPREDNISolone (MEDROL DOSEPAK) 4 MG TBPK tablet        05/02/21 2248              Dartha Lodge, PA-C 05/03/21 0117    Ernie Avena, MD 05/03/21 [phone]

## 2021-05-02 NOTE — ED Triage Notes (Signed)
Pt. Stated, Ive had pain in the sicatica, my BP is sky high, Ive not eaten in 2 days cause Ive been stuck in bed for 3 days cause I couldn't move. ?

## 2021-05-02 NOTE — ED Provider Triage Note (Signed)
Emergency Medicine Provider Triage Evaluation Note ? ?Brian Foster , a 56 y.o. male  was evaluated in triage.  Pt complains of 3 days of back pain/sciatica with radiculopathy down the left leg.  Diagnosed with this years ago.  Every now and then his flareups and comes to the ED for treatment.  Denies urinary/bowel incontinence or saddle anesthesia.  Denies recent illness/infection or fever.  Denies recent trauma to the lower back or legs. ? ?Review of Systems  ?Positive: As above ?Negative: As above ? ?Physical Exam  ?BP (!) 171/120 (BP Location: Left Arm)   Pulse (!) 50   Temp 98.1 ?F (36.7 ?C) (Oral)   Resp 16   SpO2 99%  ?Gen:   Awake, no distress   ?Resp:  Normal effort  ?MSK:   Moves extremities without difficulty  ?Other:  Subjective decreased sensation of left lower thigh.  No saddle anesthesia ? ?Medical Decision Making  ?Medically screening exam initiated at 3:43 PM.  Appropriate orders placed.  Dorse Locy was informed that the remainder of the evaluation will be completed by another provider, this initial triage assessment does not replace that evaluation, and the importance of remaining in the ED until their evaluation is complete. ? ?  ?Cecil Cobbs, PA-C ?05/02/21 1550 ? ?

## 2021-05-02 NOTE — Discharge Instructions (Addendum)
You were seen here today for Back Pain: ?Low back pain is discomfort in the lower back that may be due to injuries to muscles and ligaments around the spine. Occasionally, it may be caused by a problem to a part of the spine called a disc. Your back pain should be treated with medicines listed below as well as back exercises and this back pain should get better over the next 2 weeks. Most patients get completely well in 4 weeks. It is important to know however, if you develop severe or worsening pain, low back pain with fever, numbness, weakness or inability to walk or urinate, you should return to the ER immediately.  Please follow up with your doctor this week for a recheck if still having symptoms. ? ?HOME INSTRUCTIONS ?Self - care:  The application of heat can help soothe the pain.  Maintaining your daily activities, including walking (this is encouraged), as it will help you get better faster than just staying in bed. Do not life, push, pull anything more than 10 pounds for the next week. I am attaching back exercises that you can do at home to help facilitate your recovery.  ? ?Back Exercises - I have attached a handout on back exercises that can be done at home to help facilitate your recovery.  ? ?Medications are also useful to help with pain control.   ?Acetaminophen.  This medication is generally safe, and found over the counter. Take as directed for your age. You should not take more than 8 of the extra strength (500mg ) pills a day (max dose is 4000mg  total OVER one day) ? ?Non steroidal anti inflammatory: This includes medications including Ibuprofen, naproxen and Mobic; These medications help both pain and swelling and are very useful in treating back pain.  They should be taken with food, as they can cause stomach upset, and more seriously, stomach bleeding. Do not combine the medications.  ? ?Lidocaine Patch: Salon Pas lidocaine patches (blue and silver box) can be purchased over the counter and worn  for 12 hours for local pain relief  ? ?Muscle relaxants:  These medications can help with muscle tightness that is a cause of lower back pain.  Most of these medications can cause drowsiness, and it is not safe to drive or use dangerous machinery while taking them. They are primarily helpful when taken at night before sleep. ? ?Medrol-  This is an oral steroid.  This medication is best taken with food in the morning.  Please note that this medication can cause anxiety, mood swings, muscle fatigue, increased hunger, weight gain (sodium/fluid retention), poor sleep as well as other symptoms. If you are a diabetic, please monitor your blood sugars at home as this medication can increase your blood sugars. Call your pharmacist if you have any questions. ? ?You will need to follow up with your primary healthcare provider or the Orthopedist in 1-2 weeks for reassessment and persistent symptoms. ? ?Be aware that if you develop new symptoms, such as a fever, leg weakness, difficulty with or loss of control of your urine or bowels, abdominal pain, or more severe pain, you will need to seek medical attention and/or return to the Emergency department. ?Additional Information: ? ?Your vital signs today were: ?BP (!) 154/107   Pulse (!) 43   Temp 97.7 ?F (36.5 ?C) (Oral)   Resp 13   SpO2 99%  ?If your blood pressure (BP) was elevated above 135/85 this visit, please have this repeated by your  doctor within one month. ?---------------  ?

## 2021-05-02 NOTE — ED Triage Notes (Addendum)
EMS stated, left leg pain , he has sciatica. Marland Kitchen  ?Given Fentanly 100 at 1350 ?

## 2023-08-12 ENCOUNTER — Other Ambulatory Visit: Payer: Self-pay

## 2023-08-12 ENCOUNTER — Emergency Department (HOSPITAL_COMMUNITY): Payer: MEDICAID

## 2023-08-12 ENCOUNTER — Encounter (HOSPITAL_COMMUNITY): Payer: Self-pay

## 2023-08-12 ENCOUNTER — Emergency Department (HOSPITAL_COMMUNITY)
Admission: EM | Admit: 2023-08-12 | Discharge: 2023-08-12 | Disposition: A | Discharge status: Home / Self Care | Payer: MEDICAID | Attending: Emergency Medicine | Admitting: Emergency Medicine

## 2023-08-12 DIAGNOSIS — I10 Essential (primary) hypertension: Secondary | ICD-10-CM | POA: Insufficient documentation

## 2023-08-12 DIAGNOSIS — R519 Headache, unspecified: Secondary | ICD-10-CM | POA: Diagnosis present

## 2023-08-12 LAB — CBC
HCT: 45.1 % (ref 39.0–52.0)
Hemoglobin: 14.9 g/dL (ref 13.0–17.0)
MCH: 29.3 pg (ref 26.0–34.0)
MCHC: 33 g/dL (ref 30.0–36.0)
MCV: 88.8 fL (ref 80.0–100.0)
Platelets: 187 10*3/uL (ref 150–400)
RBC: 5.08 MIL/uL (ref 4.22–5.81)
RDW: 13.1 % (ref 11.5–15.5)
WBC: 4.3 10*3/uL (ref 4.0–10.5)
nRBC: 0 % (ref 0.0–0.2)

## 2023-08-12 LAB — BASIC METABOLIC PANEL WITH GFR
Anion gap: 10 (ref 5–15)
BUN: 21 mg/dL — ABNORMAL HIGH (ref 6–20)
CO2: 21 mmol/L — ABNORMAL LOW (ref 22–32)
Calcium: 9.3 mg/dL (ref 8.9–10.3)
Chloride: 107 mmol/L (ref 98–111)
Creatinine, Ser: 1.36 mg/dL — ABNORMAL HIGH (ref 0.61–1.24)
GFR, Estimated: >60 mL/min (ref >60)
Glucose, Bld: 87 mg/dL (ref 70–99)
Potassium: 3.3 mmol/L — ABNORMAL LOW (ref 3.5–5.1)
Sodium: 138 mmol/L (ref 135–145)

## 2023-08-12 LAB — TROPONIN I (HIGH SENSITIVITY)
Troponin I (High Sensitivity): 33 ng/L — ABNORMAL HIGH (ref <18)
Troponin I (High Sensitivity): 35 ng/L — ABNORMAL HIGH (ref <18)

## 2023-08-12 MED ORDER — METOPROLOL SUCCINATE ER 25 MG PO TB24: 25.0000 mg | ORAL_TABLET | Freq: Every day | ORAL | 0 refills | Status: AC

## 2023-08-12 MED ORDER — METOPROLOL SUCCINATE ER 25 MG PO TB24: 25.0000 mg | ORAL_TABLET | Freq: Every day | ORAL | 0 refills | Status: DC

## 2023-08-12 MED ORDER — CLONIDINE HCL 0.2 MG PO TABS
0.2000 mg | ORAL_TABLET | Freq: Once | ORAL | Status: AC
  Administered 2023-08-12: 0.2 mg via ORAL
  Filled 2023-08-12: qty 1

## 2023-08-12 MED ORDER — METOPROLOL TARTRATE 25 MG PO TABS
25.0000 mg | ORAL_TABLET | Freq: Once | ORAL | Status: AC
  Administered 2023-08-12: 25 mg via ORAL
  Filled 2023-08-12: qty 1

## 2023-08-12 NOTE — ED Provider Notes (Signed)
 [place] EMERGENCY DEPARTMENT AT [facility] Provider Note   CSN: [id number] Arrival date & time: 08/12/23  1011     Patient presents with: Hypertension   Brian Foster is a 58 y.o. male.   Pt is a 58 yo male with pmhx significant for htn, and arthritis.  Pt said he's been on multiple medications for his bp in the past.  Nothing seems to have worked.  He was put on amlodipine 5 mg a few months ago.  He said it did not help, so he stopped taking it.  He was sent over from the health department to further eval his bp.  He is minimally symptomatic with a mild headache.  He said he's cut out most of the salt in his diet.         Prior to Admission medications   Medication Sig Start Date End Date Taking? Authorizing Provider  cloNIDine  (CATAPRES ) 0.2 MG tablet Take 1 tablet (0.2 mg total) by mouth 2 (two) times daily. 08/10/18   Caccavale, Sophia, PA-C  hydrochlorothiazide  (MICROZIDE ) 12.5 MG capsule Take 1 capsule (12.5 mg total) by mouth daily. 08/10/18   Caccavale, Sophia, PA-C  hyoscyamine  (LEVSIN , ANASPAZ ) 0.125 MG tablet Take 1 tablet (0.125 mg total) by mouth every 6 (six) hours as needed for cramping. 05/23/17   Amyot, Zana Hesselbach, NP  lithium  carbonate 150 MG capsule Take 150 mg by mouth 3 (three) times daily with meals.    [provider]  methocarbamol  (ROBAXIN ) 500 MG tablet Take 1 tablet (500 mg total) by mouth 2 (two) times daily. 05/02/21   Everlyn Hockey, PA-C  methylPREDNISolone  (MEDROL  DOSEPAK) 4 MG TBPK tablet Take as directed 05/02/21   Kehrli, Kelsey F, PA-C  metoprolol succinate (TOPROL-XL) 25 MG 24 hr tablet Take 1 tablet (25 mg total) by mouth daily. 08/12/23   Sueellen Emery, MD  naproxen  (NAPROSYN ) 500 MG tablet Take 1 tablet (500 mg total) by mouth 2 (two) times daily. 05/02/21   Everlyn Hockey, PA-C  omeprazole  (PRILOSEC) 40 MG capsule Take 1 capsule (40 mg total) by mouth daily. 05/23/17   Carlee Charters, NP  oxyCODONE -acetaminophen   (PERCOCET/ROXICET) 5-325 MG tablet Take 2 tablets by mouth every 6 (six) hours as needed for severe pain. 07/07/18   Tonya Fredrickson, MD  potassium chloride  (K-DUR) 10 MEQ tablet Take 1 tablet (10 mEq total) by mouth 2 (two) times daily for 10 days. 08/18/18 08/28/18  Curatolo, Adam, DO  propranolol  (INDERAL ) 40 MG tablet Take 1 tablet (40 mg total) by mouth 3 (three) times daily for 30 days. 08/10/18 09/09/18  Caccavale, Sophia, PA-C  tiZANidine  (ZANAFLEX ) 4 MG capsule Take 1 capsule (4 mg total) by mouth 3 (three) times daily as needed for muscle spasms. 05/23/17   Carlee Charters, NP    Allergies: Patient has no known allergies.    Review of Systems  Neurological:  Positive for headaches.  All other systems reviewed and are negative.   Updated Vital Signs BP (!) 156/111   Pulse 78   Temp 97.9 F (36.6 C)   Resp 16   SpO2 99%   Physical Exam Vitals and nursing note reviewed.  Constitutional:      Appearance: Normal appearance.  HENT:     Head: Normocephalic and atraumatic.     Right Ear: External ear normal.     Left Ear: External ear normal.     Nose: Nose normal.     Mouth/Throat:  Mouth: Mucous membranes are moist.     Pharynx: Oropharynx is clear.   Eyes:     Extraocular Movements: Extraocular movements intact.     Conjunctiva/sclera: Conjunctivae normal.     Pupils: Pupils are equal, round, and reactive to light.    Cardiovascular:     Rate and Rhythm: Normal rate and regular rhythm.     Pulses: Normal pulses.     Heart sounds: Normal heart sounds.  Pulmonary:     Effort: Pulmonary effort is normal.     Breath sounds: Normal breath sounds.  Abdominal:     General: Abdomen is flat.     Palpations: Abdomen is soft.   Musculoskeletal:        General: Normal range of motion.     Cervical back: Normal range of motion and neck supple.   Skin:    General: Skin is warm.     Capillary Refill: Capillary refill takes less than 2 seconds.   Neurological:      General: No focal deficit present.     Mental Status: He is alert and oriented to person, place, and time.   Psychiatric:        Mood and Affect: Mood normal.        Behavior: Behavior normal.     (all labs ordered are listed, but only abnormal results are displayed) Labs Reviewed  BASIC METABOLIC PANEL WITH GFR - Abnormal; Notable for the following components:      Result Value   Potassium 3.3 (*)    CO2 21 (*)    BUN 21 (*)    Creatinine, Ser 1.36 (*)    All other components within normal limits  TROPONIN I (HIGH SENSITIVITY) - Abnormal; Notable for the following components:   Troponin I (High Sensitivity) 33 (*)    All other components within normal limits  TROPONIN I (HIGH SENSITIVITY) - Abnormal; Notable for the following components:   Troponin I (High Sensitivity) 35 (*)    All other components within normal limits  CBC    EKG: EKG Interpretation Date/Time:  Monday August 12 2023 10:25:38 EDT Ventricular Rate:  69 PR Interval:  138 QRS Duration:  112 QT Interval:  388 QTC Calculation: 415 R Axis:   39  Text Interpretation: Normal sinus rhythm Incomplete right bundle branch block Minimal voltage criteria for LVH, may be normal variant ( Cornell product ) Possible Anteroseptal infarct , age undetermined ST & T wave abnormality, consider inferolateral ischemia Abnormal ECG When compared with ECG of 18-Aug-2018 17:41, PREVIOUS ECG IS PRESENT new t wave changes Confirmed by Sueellen Emery [phone]) on 08/12/2023 11:41:51 AM  Radiology: CT Head Wo Contrast Result Date: 08/12/2023 CLINICAL DATA:  58 year old male with headache, hypertension. EXAM: CT HEAD WITHOUT CONTRAST TECHNIQUE: Contiguous axial images were obtained from the base of the skull through the vertex without intravenous contrast. RADIATION DOSE REDUCTION: This exam was performed according to the departmental dose-optimization program which includes automated exposure control, adjustment of the mA and/or kV according  to patient size and/or use of iterative reconstruction technique. COMPARISON:  Head CT 07/07/2018. FINDINGS: Brain: Cerebral volume remains normal. No midline shift, ventriculomegaly, mass effect, evidence of mass lesion, intracranial hemorrhage or evidence of cortically based acute infarction. Patchy mild to moderate for age bilateral cerebral white matter hypodensity. Gray-white differentiation stable since 2020. No cortical encephalomalacia identified. Vascular: Calcified atherosclerosis at the skull base. No suspicious intracranial vascular hyperdensity. Skull: Chronic right orbital wall fractures. No acute osseous abnormality identified.  Scaphocephaly, normal variant. Sinuses/Orbits: Bilateral paranasal sinus mucosal thickening and opacification is stable to mildly improved since 2020. Tympanic cavities and mastoids are well aerated. Other: No acute orbit or scalp soft tissue finding. IMPRESSION: 1. No acute intracranial abnormality. 2. Mild to moderate for age chronic cerebral white matter disease appears stable since 2020. 3. Chronic paranasal sinus disease. Chronic right orbital fractures. Electronically Signed   By: Marlise Simpers M.D.   On: 08/12/2023 11:32   DG Chest 2 View Result Date: 08/12/2023 CLINICAL DATA:  Chest pain EXAM: CHEST - 2 VIEW COMPARISON:  None Available. FINDINGS: The heart size and mediastinal contours are within normal limits. Both lungs are clear. The visualized skeletal structures are unremarkable. IMPRESSION: No active cardiopulmonary disease. Electronically Signed   By: Fredrich Jefferson M.D.   On: 08/12/2023 11:04     Procedures   Medications Ordered in the ED  cloNIDine  (CATAPRES ) tablet 0.2 mg (0.2 mg Oral Given 08/12/23 1044)  metoprolol tartrate (LOPRESSOR) tablet 25 mg (25 mg Oral Given 08/12/23 1208)                                    Medical Decision Making Amount and/or Complexity of Data Reviewed Labs: ordered. Radiology: ordered.  Risk Prescription drug  management.   This patient presents to the ED for concern of htn, this involves an extensive number of treatment options, and is a complaint that carries with it a high risk of complications and morbidity.  The differential diagnosis includes end organ damage   Co morbidities that complicate the patient evaluation   htn, and arthritis   Additional history obtained:  Additional history obtained from epic chart review External records from outside source obtained and reviewed including EMS report   Lab Tests:  I Ordered, and personally interpreted labs.  The pertinent results include:  cbc nl, bmp with cr sl elevated at 1.36 (1.11 in 2020); trop 33 and 35   Imaging Studies ordered:  I ordered imaging studies including ct head/cxr  I independently visualized and interpreted imaging which showed  CT head: 1. No acute intracranial abnormality.  2. Mild to moderate for age chronic cerebral white matter disease  appears stable since 2020.  3. Chronic paranasal sinus disease. Chronic right orbital fractures.  CXR: No active cardiopulmonary disease.  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  He's not been on metoprolol before, so I will start him on that.  I have also referred him to Dr. Gerlene Koh hypertensive clinic.   Elevated trop:  no cp.  Trops are flat.  Likely due to mild renal disease.   Reevaluation:  After the interventions noted above, I reevaluated the patient and found that they have  :improved   Social Determinants of Health:  Lives at home   Dispostion:  After consideration of the diagnostic results and the patients response to treatment, I feel that the patent would benefit from discharge with outpatient f/u.  Final diagnoses:  Hypertension, unspecified type    ED Discharge Orders          Ordered    Ambulatory referral to Cardiology        08/12/23 1319    metoprolol succinate (TOPROL-XL) 25 MG 24 hr tablet  Daily,   Status:  Discontinued        08/12/23 1319    metoprolol succinate (TOPROL-XL) 25 MG 24 hr tablet  Daily        08/12/23 1321               Sueellen Emery, MD 08/12/23 1324

## 2023-08-12 NOTE — ED Triage Notes (Signed)
 Patient arrived by [facility] from health department for Hypertension. Has not had his amlodipine for over 1 month. Patient ambulatory and alert and oriented, NAD

## 2023-08-12 NOTE — ED Provider Triage Note (Signed)
 Emergency Medicine Provider Triage Evaluation Note  Jyron Turman , a 58 y.o. male  was evaluated in triage.  Pt complains of elevated blood pressure, headache.  Review of Systems  Positive: Mild headache Negative: Weakness, numbness, tingling, chest pain  Physical Exam  BP (!) 181/117 (BP Location: Right Arm)   Pulse 62   Temp 97.9 F (36.6 C)   Resp 18   SpO2 100%  Gen:   Awake, no distress   Resp:  Normal effort  MSK:   Moves extremities without difficulty  Other:  No gross neurological deficits noted  Medical Decision Making  Medically screening exam initiated at 10:34 AM.  Appropriate orders placed.  Boyd Buffalo was informed that the remainder of the evaluation will be completed by another provider, this initial triage assessment does not replace that evaluation, and the importance of remaining in the ED until their evaluation is complete.  58 year old male presenting to the emergency department after being sent here by the health department for elevated blood pressure.  The patient is minimally symptomatic with a mild frontal headache.  States that he has been out of blood pressure medication for the past month.  States that amlodipine has not worked in the past and thus what he is been on most recently.  Will give the patient a dose of clonidine  as it appears that he has been on this since in the past.  Will obtain labs to evaluate for endorgan dysfunction and CT head regarding the headache.   Carin Charleston, MD 08/12/23 [phone]

## 2023-10-01 ENCOUNTER — Other Ambulatory Visit (HOSPITAL_COMMUNITY): Payer: Self-pay

## 2023-10-01 ENCOUNTER — Telehealth: Payer: Self-pay

## 2023-10-01 NOTE — Telephone Encounter (Signed)
 Pharmacy Patient Advocate Encounter  Insurance verification completed.   The patient is insured through [place]   Ran test claim for [employer]. Currently a quantity of 84 is a 28 day supply and the co-pay is $4.00 . Epclusa $4.00  This test claim was processed through [facility]- copay amounts may vary at other pharmacies due to pharmacy/plan contracts, or as the patient moves through the different stages of their insurance plan.

## 2023-10-10 ENCOUNTER — Other Ambulatory Visit: Payer: Self-pay

## 2023-10-10 ENCOUNTER — Ambulatory Visit (INDEPENDENT_AMBULATORY_CARE_PROVIDER_SITE_OTHER): Payer: MEDICAID | Admitting: Family

## 2023-10-10 ENCOUNTER — Encounter: Payer: Self-pay | Admitting: Family

## 2023-10-10 VITALS — BP 131/88 | HR 72 | Temp 97.8°F | Ht 66.0 in | Wt 155.0 lb

## 2023-10-10 DIAGNOSIS — B182 Chronic viral hepatitis C: Secondary | ICD-10-CM | POA: Insufficient documentation

## 2023-10-10 NOTE — Patient Instructions (Signed)
Nice to see you.  We will check your lab work today.  Plan for follow up in 1 month after start of medication.  Have a great day and stay safe!  Limit acetaminophen (Tylenol) usage to no more than 2 grams (2,000 mg) per day.  Avoid alcohol.  Do not share toothbrushes or razors.  Practice safe sex to protect against transmission as well as sexually transmitted disease.    Hepatitis C Hepatitis C is a viral infection of the liver. It can lead to scarring of the liver (cirrhosis), liver failure, or liver cancer. Hepatitis C may go undetected for months or years because people with the infection may not have symptoms, or they may have only mild symptoms. What are the causes? This condition is caused by the hepatitis C virus (HCV). The virus can spread from person to person (is contagious) through: Blood. Childbirth. A woman who has hepatitis C can pass it to her baby during birth. Bodily fluids, such as breast milk, tears, semen, vaginal fluids, and saliva. Blood transfusions or organ transplants done in the [place] before 1992.  What increases the risk? The following factors may make you more likely to develop this condition: Having contact with unclean (contaminated) needles or syringes. This may result from: Acupuncture. Tattoing. Body piercing. Injecting drugs. Having unprotected sex with someone who is infected. Needing treatment to filter your blood (kidney dialysis). Having HIV (human immunodeficiency virus) or AIDS (acquired immunodeficiency syndrome). Working in a job that involves contact with blood or bodily fluids, such as health care.  What are the signs or symptoms? Symptoms of this condition include: Fatigue. Loss of appetite. Nausea. Vomiting. Abdominal pain. Dark yellow urine. Yellowish skin and eyes (jaundice). Itchy skin. Clay-colored bowel movements. Joint pain. Bleeding and bruising easily. Fluid building up in your stomach (ascites).  In  some cases, you may not have any symptoms. How is this diagnosed? This condition is diagnosed with: Blood tests. Other tests to check how well your liver is functioning. They may include: Magnetic resonance elastography (MRE). This imaging test uses MRIs and sound waves to measure liver stiffness. Transient elastography. This imaging test uses ultrasounds to measure liver stiffness. Liver biopsy. This test requires taking a small tissue sample from your liver to examine it under a microscope.  How is this treated? Your health care provider may perform noninvasive tests or a liver biopsy to help decide the best course of treatment. Treatment may include: Antiviral medicines and other medicines. Follow-up treatments every 6-12 months for infections or other liver conditions. Receiving a donated liver (liver transplant).  Follow these instructions at home: Medicines Take over-the-counter and prescription medicines only as told by your health care provider. Take your antiviral medicine as told by your health care provider. Do not stop taking the antiviral even if you start to feel better. Do not take any medicines unless approved by your health care provider, including over-the-counter medicines and birth control pills. Activity Rest as needed. Do not have sex unless approved by your health care provider. Ask your health care provider when you may return to school or work. Eating and drinking Eat a balanced diet with plenty of fruits and vegetables, whole grains, and lowfat (lean) meats or non-meat proteins (such as beans or tofu). Drink enough fluids to keep your urine clear or pale yellow. Do not drink alcohol. General instructions Do not share toothbrushes, nail clippers, or razors. Wash your hands frequently with soap and water. If soap and water are not  available, use hand sanitizer. Cover any cuts or open sores on your skin to prevent spreading the virus. Keep all follow-up visits  as told by your health care provider. This is important. You may need follow-up visits every 6-12 months. How is this prevented? There is no vaccine for hepatitis C. The only way to prevent the disease is to reduce the risk of exposure to the virus. Make sure you: Wash your hands frequently with soap and water. If soap and water are not available, use hand sanitizer. Do not share needles or syringes. Practice safe sex and use condoms. Avoid handling blood or bodily fluids without gloves or other protection. Avoid getting tattoos or piercings in shops or other locations that are not clean.  Contact a health care provider if: You have a fever. You develop abdominal pain. You pass dark urine. You pass clay-colored stools. You develop joint pain. Get help right away if: You have increasing fatigue or weakness. You lose your appetite. You cannot eat or drink without vomiting. You develop jaundice or your jaundice gets worse. You bruise or bleed easily. Summary Hepatitis C is a viral infection of the liver. It can lead to scarring of the liver (cirrhosis), liver failure, or liver cancer. The hepatitis C virus (HCV) causes this condition. The virus can pass from person to person (is contagious). You should not take any medicines unless approved by your health care provider. This includes over-the-counter medicines and birth control pills. This information is not intended to replace advice given to you by your health care provider. Make sure you discuss any questions you have with your health care provider. Document Released: 02/10/2000 Document Revised: 03/20/2016 Document Reviewed: 03/20/2016 Elsevier Interactive Patient Education  [employer].
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
## 2023-10-10 NOTE — Assessment & Plan Note (Signed)
 Mr. Carton is a 58 y/o AA male with chronic Hepatitis C with initial Hepatitis C RNA level of 9.81 million. Source of infection is unclear. Treatment naive and asymptomatic. Discussed the basics of Hepatitis C including transmission, risk if left untreated, lab work, treatment options including side effects, available financial assistance, and plan of care. Check remaining lab work. Will plan for 8 weeks of Mavyret pending any changes based on lab work. Follow up in 1 month after starting medication or sooner if needed.

## 2023-10-15 LAB — CBC
HCT: 44.5 % (ref 38.5–50.0)
Hemoglobin: 15 g/dL (ref 13.2–17.1)
MCH: 30 pg (ref 27.0–33.0)
MCHC: 33.7 g/dL (ref 32.0–36.0)
MCV: 89 fL (ref 80.0–100.0)
MPV: 11.7 fL (ref 7.5–12.5)
Platelets: 203 Thousand/uL (ref 140–400)
RBC: 5 Million/uL (ref 4.20–5.80)
RDW: 13 % (ref 11.0–15.0)
WBC: 6.8 Thousand/uL (ref 3.8–10.8)

## 2023-10-15 LAB — HIV ANTIBODY (ROUTINE TESTING W REFLEX): HIV 1&2 Ab, 4th Generation: NONREACTIVE

## 2023-10-15 LAB — HEPATIC FUNCTION PANEL
AG Ratio: 1.1 (calc) (ref 1.0–2.5)
ALT: 35 U/L (ref 9–46)
AST: 29 U/L (ref 10–35)
Albumin: 4.3 g/dL (ref 3.6–5.1)
Alkaline phosphatase (APISO): 113 U/L (ref 35–144)
Bilirubin, Direct: 0.2 mg/dL (ref 0.0–0.2)
Globulin: 3.9 g/dL — ABNORMAL HIGH (ref 1.9–3.7)
Indirect Bilirubin: 0.7 mg/dL (ref 0.2–1.2)
Total Bilirubin: 0.9 mg/dL (ref 0.2–1.2)
Total Protein: 8.2 g/dL — ABNORMAL HIGH (ref 6.1–8.1)

## 2023-10-15 LAB — BASIC METABOLIC PANEL WITH GFR
BUN/Creatinine Ratio: 8 (calc) (ref 6–22)
BUN: 15 mg/dL (ref 7–25)
CO2: 27 mmol/L (ref 20–32)
Calcium: 9.6 mg/dL (ref 8.6–10.3)
Chloride: 102 mmol/L (ref 98–110)
Creat: 1.79 mg/dL — ABNORMAL HIGH (ref 0.70–1.30)
Glucose, Bld: 82 mg/dL (ref 65–99)
Potassium: 3.5 mmol/L (ref 3.5–5.3)
Sodium: 140 mmol/L (ref 135–146)
eGFR: 44 mL/min/1.73m2 — ABNORMAL LOW (ref >=60)

## 2023-10-15 LAB — LIVER FIBROSIS, FIBROTEST-ACTITEST
ALT: 33 U/L (ref 9–46)
Bilirubin: 220 mg/dL — AB (ref 94–176)
Fibrosis Score: 603 mg/dL — AB (ref 106–279)
GGT: 127 U/L — AB (ref 9–85)
GGT: 127 mg/dL — AB (ref 3–85)
Haptoglobin: 220 mg/dL — AB (ref 94–212)
Haptoglobin: 603 mg/dL — AB (ref 43–279)

## 2023-10-15 LAB — PROTIME-INR
INR: 1.1
Prothrombin Time: 11.3 s (ref 9.0–11.5)

## 2023-10-15 LAB — HEPATITIS C GENOTYPE

## 2023-10-21 ENCOUNTER — Ambulatory Visit: Payer: Self-pay | Admitting: Family

## 2023-10-21 DIAGNOSIS — B182 Chronic viral hepatitis C: Secondary | ICD-10-CM

## 2023-10-29 ENCOUNTER — Ambulatory Visit (HOSPITAL_COMMUNITY): Payer: MEDICAID

## 2023-10-29 NOTE — Progress Notes (Signed)
 2nd attempt to reach the patient no answer.

## 2023-11-12 ENCOUNTER — Ambulatory Visit (HOSPITAL_COMMUNITY)
Admission: RE | Admit: 2023-11-12 | Discharge: 2023-11-12 | Disposition: A | Discharge status: Home / Self Care | Payer: MEDICAID | Source: Ambulatory Visit | Attending: Family | Admitting: Family

## 2023-11-12 DIAGNOSIS — B182 Chronic viral hepatitis C: Secondary | ICD-10-CM | POA: Diagnosis present

## 2023-11-12 DIAGNOSIS — R932 Abnormal findings on diagnostic imaging of liver and biliary tract: Secondary | ICD-10-CM | POA: Insufficient documentation

## 2023-11-15 ENCOUNTER — Emergency Department (HOSPITAL_COMMUNITY)
Admission: EM | Admit: 2023-11-15 | Discharge: 2023-11-15 | Disposition: A | Discharge status: Home / Self Care | Payer: MEDICAID | Attending: Emergency Medicine | Admitting: Emergency Medicine

## 2023-11-15 ENCOUNTER — Encounter (HOSPITAL_COMMUNITY): Payer: Self-pay

## 2023-11-15 ENCOUNTER — Other Ambulatory Visit: Payer: Self-pay

## 2023-11-15 DIAGNOSIS — Z79899 Other long term (current) drug therapy: Secondary | ICD-10-CM | POA: Insufficient documentation

## 2023-11-15 DIAGNOSIS — I10 Essential (primary) hypertension: Secondary | ICD-10-CM | POA: Insufficient documentation

## 2023-11-15 DIAGNOSIS — L0201 Cutaneous abscess of face: Secondary | ICD-10-CM | POA: Diagnosis present

## 2023-11-15 MED ORDER — LIDOCAINE-EPINEPHRINE 1 %-1:100000 IJ SOLN
20.0000 mL | Freq: Once | INTRAMUSCULAR | Status: AC
  Administered 2023-11-15: 20 mL via INTRADERMAL
  Filled 2023-11-15: qty 1

## 2023-11-15 MED ORDER — DOXYCYCLINE HYCLATE 100 MG PO CAPS: 100.0000 mg | ORAL_CAPSULE | Freq: Two times a day (BID) | ORAL | 0 refills | Status: AC

## 2023-11-15 MED ORDER — DOXYCYCLINE HYCLATE 100 MG PO CAPS: 100.0000 mg | ORAL_CAPSULE | Freq: Two times a day (BID) | ORAL | 0 refills | Status: DC

## 2023-11-15 NOTE — ED Provider Notes (Signed)
 [place] EMERGENCY DEPARTMENT AT [facility] Provider Note   CSN: [id number] Arrival date & time: 11/15/23  9070     Patient presents with: Abscess   Brian Foster is a 58 y.o. male.    Abscess Associated symptoms: no fever    This patient is a 58 year old male with a history of hypertension and hepatitis C, presents to the hospital today with a complaint of a swollen area on the left side of his face at the angle of his mandible on the left which occurred about a week ago after he had been shaving, he reports that it started as a small area that was swollen and gradually enlarged, there is no fevers, it is not draining, it is becoming more tender but does not restrict his ability to open and close his mouth    Prior to Admission medications   Medication Sig Start Date End Date Taking? Authorizing Provider  doxycycline  (VIBRAMYCIN ) 100 MG capsule Take 1 capsule (100 mg total) by mouth 2 (two) times daily. 11/15/23  Yes Cleotilde Rogue, MD  amLODipine (NORVASC) 10 MG tablet Take 10 mg by mouth daily. 09/24/23   [provider]  cloNIDine  (CATAPRES ) 0.2 MG tablet Take 1 tablet (0.2 mg total) by mouth 2 (two) times daily. 08/10/18   Caccavale, Sophia, PA-C  hydrochlorothiazide  (MICROZIDE ) 12.5 MG capsule Take 1 capsule (12.5 mg total) by mouth daily. 08/10/18   Caccavale, Sophia, PA-C  hyoscyamine  (LEVSIN , ANASPAZ ) 0.125 MG tablet Take 1 tablet (0.125 mg total) by mouth every 6 (six) hours as needed for cramping. 05/23/17   Amyot, Jenkins Lesches, NP  lithium  carbonate 150 MG capsule Take 150 mg by mouth 3 (three) times daily with meals.    [provider]  losartan (COZAAR) 50 MG tablet Take 50 mg by mouth daily. 09/11/23   [provider]  methocarbamol  (ROBAXIN ) 500 MG tablet Take 1 tablet (500 mg total) by mouth 2 (two) times daily. 05/02/21   Alva Larraine FALCON, PA-C  methylPREDNISolone  (MEDROL  DOSEPAK) 4 MG TBPK tablet Take as directed Patient not taking:  Reported on 10/10/2023 05/02/21   Alva Larraine FALCON, PA-C  metoprolol  succinate (TOPROL -XL) 25 MG 24 hr tablet Take 1 tablet (25 mg total) by mouth daily. 08/12/23   Dean Clarity, MD  naproxen  (NAPROSYN ) 500 MG tablet Take 1 tablet (500 mg total) by mouth 2 (two) times daily. 05/02/21   Alva Larraine FALCON, PA-C  omeprazole  (PRILOSEC) 40 MG capsule Take 1 capsule (40 mg total) by mouth daily. Patient not taking: Reported on 10/10/2023 05/23/17   Pearl Jenkins Lesches, NP  oxyCODONE -acetaminophen  (PERCOCET/ROXICET) 5-325 MG tablet Take 2 tablets by mouth every 6 (six) hours as needed for severe pain. Patient not taking: Reported on 10/10/2023 07/07/18   Towana Ozell BROCKS, MD  potassium chloride  (K-DUR) 10 MEQ tablet Take 1 tablet (10 mEq total) by mouth 2 (two) times daily for 10 days. Patient not taking: Reported on 10/10/2023 08/18/18 08/28/18  Ruthe Cornet, DO  propranolol  (INDERAL ) 40 MG tablet Take 1 tablet (40 mg total) by mouth 3 (three) times daily for 30 days. 08/10/18 10/10/23  Caccavale, Sophia, PA-C  tiZANidine  (ZANAFLEX ) 4 MG capsule Take 1 capsule (4 mg total) by mouth 3 (three) times daily as needed for muscle spasms. Patient not taking: Reported on 10/10/2023 05/23/17   Pearl Jenkins Lesches, NP    Allergies: Patient has no known allergies.    Review of Systems  Constitutional:  Negative for fever.  Skin:  Positive  for rash.    Updated Vital Signs BP (!) 141/108   Pulse 72   Temp 98.8 F (37.1 C)   Resp 16   Ht 1.702 m (5' 7)   Wt 77.1 kg   SpO2 96%   BMI 26.63 kg/m   Physical Exam Vitals and nursing note reviewed.  Constitutional:      Appearance: He is well-developed. He is not diaphoretic.  HENT:     Head: Normocephalic and atraumatic.  Eyes:     General:        Right eye: No discharge.        Left eye: No discharge.     Conjunctiva/sclera: Conjunctivae normal.  Pulmonary:     Effort: Pulmonary effort is normal. No respiratory distress.  Musculoskeletal:     Cervical back:  Normal range of motion. No rigidity.  Lymphadenopathy:     Cervical: No cervical adenopathy.  Skin:    General: Skin is warm and dry.     Findings: Erythema and rash present.     Comments: There is an area about 1.5 cm in diameter over the left angle of the mandible which is swollen fluctuant and a small spot of draining abscess.  There is no lymphadenopathy around the send no trismus or torticollis of the face or neck.  Neurological:     Mental Status: He is alert.     Coordination: Coordination normal.     (all labs ordered are listed, but only abnormal results are displayed) Labs Reviewed - No data to display  EKG: None  Radiology: No results found.   .Incision and Drainage  Date/Time: 11/15/2023 9:51 AM  Performed by: Cleotilde Rogue, MD Authorized by: Cleotilde Rogue, MD   Consent:    Consent obtained:  Verbal   Consent given by:  Patient   Risks discussed:  Bleeding, damage to other organs, incomplete drainage, infection and pain   Alternatives discussed:  Delayed treatment Universal protocol:    Procedure explained and questions answered to patient or proxy's satisfaction: yes     Immediately prior to procedure, a time out was called: yes     Patient identity confirmed:  Verbally with patient Location:    Type:  Abscess   Location:  Head   Head location:  Face Pre-procedure details:    Skin preparation:  Betadine Sedation:    Sedation type:  None Anesthesia:    Anesthesia method:  Local infiltration   Local anesthetic:  Lidocaine  1% WITH epi Procedure type:    Complexity:  Complex Procedure details:    Ultrasound guidance: no     Needle aspiration: no     Incision types:  Single straight   Incision depth:  Submucosal   Wound management:  Probed and deloculated, irrigated with saline and extensive cleaning   Drainage:  Purulent   Drainage amount:  Moderate   Wound treatment:  Wound left open   Packing materials:  None Post-procedure details:    Procedure  completion:  Tolerated well, no immediate complications Comments:          Medications Ordered in the ED  lidocaine -EPINEPHrine  (XYLOCAINE  W/EPI) 1 %-1:100000 (with pres) injection 20 mL (20 mLs Intradermal Given 11/15/23 0954)                                    Medical Decision Making Risk Prescription drug management.   Patient is agreeable to incision and  drainage, see procedure note  Home on doxycycline , no signs of sepsis or severe deep tissue infection, this appears very external and the epidermal skin, does not appear to involve the underlying muscular structures or bony structures of the face     Final diagnoses:  Facial abscess    ED Discharge Orders          Ordered    doxycycline  (VIBRAMYCIN ) 100 MG capsule  2 times daily        11/15/23 1008               Cleotilde Rogue, MD 11/15/23 1009

## 2023-11-15 NOTE — Discharge Instructions (Signed)
 The abscess has been drained and we will put you on doxycycline  twice a day for 10 days, if you develop recurrent swelling on your face without any drainage you can be seen immediately to try to get on an antibiotic but if the swelling becomes severe or worse she will need to be seen again.  Fevers or chills should prompt a visit to your doctor.  Please use hot compresses or hot showers twice a day to help this to continue to drain.

## 2023-11-15 NOTE — ED Triage Notes (Signed)
 Abscess to the left side of his face , onset several days ago worse today.

## 2023-11-21 ENCOUNTER — Ambulatory Visit: Payer: Self-pay | Admitting: Family

## 2023-11-21 DIAGNOSIS — B182 Chronic viral hepatitis C: Secondary | ICD-10-CM

## 2023-11-28 ENCOUNTER — Other Ambulatory Visit: Payer: Self-pay | Admitting: Pharmacist

## 2023-11-28 ENCOUNTER — Other Ambulatory Visit: Payer: Self-pay

## 2023-11-28 ENCOUNTER — Other Ambulatory Visit (HOSPITAL_COMMUNITY): Payer: Self-pay

## 2023-11-28 MED ORDER — MAVYRET 100-40 MG PO TABS
3.0000 | ORAL_TABLET | Freq: Every day | ORAL | 1 refills | Status: DC
  Filled 2023-11-28: qty 84, 28d supply, fill #0
  Filled 2023-12-25: qty 84, 28d supply, fill #1

## 2023-11-28 NOTE — Progress Notes (Signed)
 Patient is approved to receive Mavyret x 8 weeks for chronic Hepatitis C infection. Counseled patient to take all three tablets of Mavyret daily with food.  Counseled patient the need to take all three tablets together and to not separate them out during the day. Encouraged patient not to miss any doses and explained how their chance of cure could go down with each dose missed. Counseled patient on what to do if dose is missed - if it is closer to the missed dose take immediately; if closer to next dose then skip dose and take the next dose at the usual time. Counseled patient on common side effects such as headache, fatigue, and nausea and that these normally decrease with time. I reviewed patient medications and found no drug interactions. Discussed with patient that there are several drug interactions with Mavyret and instructed patient to call the clinic if he wishes to start a new medication during course of therapy. Also advised patient to call if he experiences any side effects. Patient will follow-up with me in the pharmacy clinic on 11/11.  Alan Geralds, PharmD, CPP, BCIDP, AAHIVP Clinical Pharmacist Practitioner Infectious Diseases Clinical Pharmacist [facility]

## 2023-11-28 NOTE — Progress Notes (Signed)
 Specialty Pharmacy Initiation Note   Brian Foster is a 58 y.o. male who will be followed by the specialty pharmacy service for RxSp Hepatitis C    Review of administration, indication, effectiveness, safety, potential side effects, storage/disposable, and missed dose instructions occurred today for patient's specialty medication(s) Glecaprevir-Pibrentasvir [facility])     Patient/Caregiver did not have any additional questions or concerns.   Patient's therapy is appropriate to: Initiate    Goals Addressed             This Visit's Progress    Achieve virologic cure as evidenced by SVR       Patient is initiating therapy. Patient will be evaluated at upcoming provider appointment to assess progress      Comply with lab assessments       Patient is on track. Patient will adhere to provider and/or lab appointments      Maintain optimal adherence to therapy       Patient is initiating therapy. Patient will be evaluated at upcoming provider appointment to assess progress         Alan JINNY Geralds Specialty Pharmacist

## 2023-11-28 NOTE — Progress Notes (Signed)
 Specialty Pharmacy Initial Fill Coordination Note  Brian Foster is a 58 y.o. male contacted today regarding initial fill of specialty medication(s) Glecaprevir-Pibrentasvir [facility])   Patient requested Courier to Provider Office   Delivery date: 12/02/23   Verified address: [address] [place] [address]   Medication will be filled on 11/29/23.   Patient is aware of 4.00 copayment.  Put on AR/Acct

## 2023-11-29 ENCOUNTER — Other Ambulatory Visit: Payer: Self-pay

## 2023-12-02 ENCOUNTER — Telehealth: Payer: Self-pay

## 2023-12-02 NOTE — Telephone Encounter (Signed)
 [facility] Patient Advocate Encounter  Patient's medications Mavyret have been couriered to [facility] from [facility] and will be  picked up on 12/03/23.  Arland Hutchinson, CPhT Specialty Pharmacy Patient [facility] for Infectious Disease Phone: [phone] Fax:  [phone]

## 2023-12-05 ENCOUNTER — Other Ambulatory Visit: Payer: Self-pay

## 2023-12-16 NOTE — Progress Notes (Deleted)
  Cardiology Office Note:  .   Date:  12/16/2023  ID:  Brian Foster, DOB 06/25/1965, MRN [id number] PCP: Patient, No Pcp Per  [place] Providers Cardiologist:  None { Click to update primary MD,subspecialty MD or APP then REFRESH:1}   History of Present Illness: .   No chief complaint on file.   Brian Foster is a 59 y.o. male with history of HTN who presents for the evaluation of abnormal EKG at the request of Dean Clarity, MD.     Problem List HTN HCV    ROS: All other ROS reviewed and negative. Pertinent positives noted in the HPI.     Studies Reviewed: SABRA       Physical Exam:   VS:  There were no vitals taken for this visit.   Wt Readings from Last 3 Encounters:  11/15/23 170 lb (77.1 kg)  10/10/23 155 lb (70.3 kg)  08/09/18 172 lb (78 kg)    GEN: Well nourished, well developed in no acute distress NECK: No JVD; No carotid bruits CARDIAC: ***RRR, no murmurs, rubs, gallops RESPIRATORY:  Clear to auscultation without rales, wheezing or rhonchi  ABDOMEN: Soft, non-tender, non-distended EXTREMITIES:  No edema; No deformity  ASSESSMENT AND PLAN: .   ***    {Are you ordering a CV Procedure (e.g. stress test, cath, DCCV, TEE, etc)?   Press F2        :[id number]}   Follow-up: No follow-ups on file.  Time Spent with Patient: I have spent a total of *** minutes caring for this patient today face to face, ordering and reviewing labs/tests, reviewing prior records/medical history, examining the patient, establishing an assessment and plan, communicating results/findings to the patient/family, and documenting in the medical record.   Signed, Darryle DASEN. Barbaraann, MD, [facility]  [address] [place], [place] [address] [phone]  10:09 PM

## 2023-12-18 ENCOUNTER — Ambulatory Visit: Payer: MEDICAID | Attending: Cardiovascular Disease | Admitting: Cardiovascular Disease

## 2023-12-18 DIAGNOSIS — I150 Renovascular hypertension: Secondary | ICD-10-CM

## 2023-12-18 DIAGNOSIS — R9431 Abnormal electrocardiogram [ECG] [EKG]: Secondary | ICD-10-CM

## 2023-12-20 ENCOUNTER — Other Ambulatory Visit (HOSPITAL_COMMUNITY): Payer: Self-pay

## 2023-12-25 ENCOUNTER — Other Ambulatory Visit: Payer: Self-pay

## 2023-12-25 NOTE — Progress Notes (Signed)
 Specialty Pharmacy Refill Coordination Note  Brian Foster is a 58 y.o. male contacted today regarding refills of specialty medication(s) Glecaprevir-Pibrentasvir [facility])   Patient requested Delivery   Delivery date: 12/30/23   Verified address: [address], [place], [address]   Medication will be filled on: 12/27/23

## 2023-12-27 ENCOUNTER — Other Ambulatory Visit: Payer: Self-pay

## 2024-01-06 NOTE — Progress Notes (Unsigned)
 HPI: Brian Foster is a 58 y.o. male who presents to the [facility] pharmacy clinic for Hepatitis C follow-up.  Medication: Mavyret (glecaprevir/pibrentasvir)  Start Date: 12/05/2023  Hepatitis C Genotype: 1b  Fibrosis Score: F3-F4 with ultrasound showing signs consistent with cirrhosis   Hepatitis C RNA: 9.81 million (09/11/2023)   Referring ID Provider: Cathlyn July, NP   Patient Active Problem List   Diagnosis Date Noted   Chronic hepatitis C without hepatic coma (HCC) 10/10/2023   MDD (major depressive disorder) 08/10/2018    Patient's Medications  New Prescriptions   No medications on file  Previous Medications   AMLODIPINE (NORVASC) 10 MG TABLET    Take 10 mg by mouth daily.   CLONIDINE  (CATAPRES ) 0.2 MG TABLET    Take 1 tablet (0.2 mg total) by mouth 2 (two) times daily.   DOXYCYCLINE  (VIBRAMYCIN ) 100 MG CAPSULE    Take 1 capsule (100 mg total) by mouth 2 (two) times daily.   GLECAPREVIR-PIBRENTASVIR (MAVYRET) 100-40 MG TABS    Take 3 tablets by mouth daily. Take 3 tablets by mouth daily with a meal.   HYDROCHLOROTHIAZIDE  (MICROZIDE ) 12.5 MG CAPSULE    Take 1 capsule (12.5 mg total) by mouth daily.   HYOSCYAMINE  (LEVSIN , ANASPAZ ) 0.125 MG TABLET    Take 1 tablet (0.125 mg total) by mouth every 6 (six) hours as needed for cramping.   LITHIUM  CARBONATE 150 MG CAPSULE    Take 150 mg by mouth 3 (three) times daily with meals.   LOSARTAN (COZAAR) 50 MG TABLET    Take 50 mg by mouth daily.   METHOCARBAMOL  (ROBAXIN ) 500 MG TABLET    Take 1 tablet (500 mg total) by mouth 2 (two) times daily.   METHYLPREDNISOLONE  (MEDROL  DOSEPAK) 4 MG TBPK TABLET    Take as directed   METOPROLOL  SUCCINATE (TOPROL -XL) 25 MG 24 HR TABLET    Take 1 tablet (25 mg total) by mouth daily.   NAPROXEN  (NAPROSYN ) 500 MG TABLET    Take 1 tablet (500 mg total) by mouth 2 (two) times daily.   OMEPRAZOLE  (PRILOSEC) 40 MG CAPSULE    Take 1 capsule (40 mg total) by mouth daily.   OXYCODONE -ACETAMINOPHEN   (PERCOCET/ROXICET) 5-325 MG TABLET    Take 2 tablets by mouth every 6 (six) hours as needed for severe pain.   POTASSIUM CHLORIDE  (K-DUR) 10 MEQ TABLET    Take 1 tablet (10 mEq total) by mouth 2 (two) times daily for 10 days.   PROPRANOLOL  (INDERAL ) 40 MG TABLET    Take 1 tablet (40 mg total) by mouth 3 (three) times daily for 30 days.   TIZANIDINE  (ZANAFLEX ) 4 MG CAPSULE    Take 1 capsule (4 mg total) by mouth 3 (three) times daily as needed for muscle spasms.  Modified Medications   No medications on file  Discontinued Medications   No medications on file    Labs: Hepatitis C Lab Results  Component Value Date   HCVGENOTYPE 1b 10/10/2023   Hepatitis B No results found for: HEPBSAB, HEPBSAG, HEPBCAB Hepatitis A No results found for: HAV HIV Lab Results  Component Value Date   HIV NON-REACTIVE 10/10/2023   Lab Results  Component Value Date   CREATININE 1.79 (H) 10/10/2023   CREATININE 1.36 (H) 08/12/2023   CREATININE 1.11 08/18/2018   CREATININE 1.19 08/10/2018   CREATININE 1.09 08/09/2018   Lab Results  Component Value Date   AST 29 10/10/2023   AST 40 08/18/2018   AST 47 (H) 08/10/2018  ALT 35 10/10/2023   ALT 33 10/10/2023   ALT 41 08/18/2018   INR 1.1 10/10/2023    Assessment: Brian Foster presents today for 28-month follow up for his chronic Hepatitis C infection. He started Mavyret on 12/05/2023. He reports no missed doses and takes all three tablets together with breakfast. He reports starting his second box of Mavyret on 11/3 and reports having 3 weeks of therapy left. He reports no side effects and is tolerating Mavyret.    Labs: HCV RNA  Eligible vaccinations: Influenza, covid, Heplisav 1/2, Shingrix 1/2, PCV20 - Patient politely declines vaccines today     Plan: - Continue Mavyret  - HCV RNA today  - Follow up with Cassie on 02/03/2024  Brian Foster, PharmD PGY2 Infectious Diseases Pharmacy Resident

## 2024-01-07 ENCOUNTER — Ambulatory Visit: Payer: MEDICAID | Admitting: Pharmacist

## 2024-01-07 ENCOUNTER — Other Ambulatory Visit: Payer: Self-pay

## 2024-01-07 DIAGNOSIS — B182 Chronic viral hepatitis C: Secondary | ICD-10-CM | POA: Diagnosis not present

## 2024-01-09 LAB — HEPATITIS C RNA QUANTITATIVE
HCV Quantitative Log: <1.18 {Log_IU}/mL — AB
HCV RNA, PCR, QN: <15 [IU]/mL — AB

## 2024-01-14 ENCOUNTER — Ambulatory Visit (HOSPITAL_COMMUNITY): Payer: MEDICAID | Admitting: Mental Health

## 2024-01-15 ENCOUNTER — Other Ambulatory Visit (HOSPITAL_COMMUNITY): Payer: Self-pay

## 2024-01-17 ENCOUNTER — Other Ambulatory Visit: Payer: Self-pay

## 2024-02-03 ENCOUNTER — Other Ambulatory Visit: Payer: Self-pay

## 2024-02-03 ENCOUNTER — Ambulatory Visit (INDEPENDENT_AMBULATORY_CARE_PROVIDER_SITE_OTHER): Payer: MEDICAID | Admitting: Pharmacist

## 2024-02-03 DIAGNOSIS — B182 Chronic viral hepatitis C: Secondary | ICD-10-CM | POA: Diagnosis not present

## 2024-02-03 NOTE — Progress Notes (Signed)
 HPI: Brian Foster is a 58 y.o. male who presents to the [facility] pharmacy clinic for Hepatitis C follow-up.  Medication: Mavyret  (glecaprevir /pibrentasvir )   Start Date: 12/05/2023   Hepatitis C Genotype: 1b   Fibrosis Score: F3-F4 with ultrasound showing signs consistent with cirrhosis    Hepatitis C RNA: 9.81 million (09/11/2023); <15 on 01/07/24  Referring ID Provider: Cathlyn July, NP  Patient Active Problem List   Diagnosis Date Noted   Chronic hepatitis C without hepatic coma (HCC) 10/10/2023   MDD (major depressive disorder) 08/10/2018    Patient's Medications  New Prescriptions   No medications on file  Previous Medications   AMLODIPINE (NORVASC) 10 MG TABLET    Take 10 mg by mouth daily.   CLONIDINE  (CATAPRES ) 0.2 MG TABLET    Take 1 tablet (0.2 mg total) by mouth 2 (two) times daily.   DOXYCYCLINE  (VIBRAMYCIN ) 100 MG CAPSULE    Take 1 capsule (100 mg total) by mouth 2 (two) times daily.   GLECAPREVIR -PIBRENTASVIR  (MAVYRET ) 100-40 MG TABS    Take 3 tablets by mouth daily. Take 3 tablets by mouth daily with a meal.   HYDROCHLOROTHIAZIDE  (MICROZIDE ) 12.5 MG CAPSULE    Take 1 capsule (12.5 mg total) by mouth daily.   HYOSCYAMINE  (LEVSIN , ANASPAZ ) 0.125 MG TABLET    Take 1 tablet (0.125 mg total) by mouth every 6 (six) hours as needed for cramping.   LITHIUM  CARBONATE 150 MG CAPSULE    Take 150 mg by mouth 3 (three) times daily with meals.   LOSARTAN (COZAAR) 50 MG TABLET    Take 50 mg by mouth daily.   METHOCARBAMOL  (ROBAXIN ) 500 MG TABLET    Take 1 tablet (500 mg total) by mouth 2 (two) times daily.   METHYLPREDNISOLONE  (MEDROL  DOSEPAK) 4 MG TBPK TABLET    Take as directed   METOPROLOL  SUCCINATE (TOPROL -XL) 25 MG 24 HR TABLET    Take 1 tablet (25 mg total) by mouth daily.   NAPROXEN  (NAPROSYN ) 500 MG TABLET    Take 1 tablet (500 mg total) by mouth 2 (two) times daily.   OMEPRAZOLE  (PRILOSEC) 40 MG CAPSULE    Take 1 capsule (40 mg total) by mouth daily.    OXYCODONE -ACETAMINOPHEN  (PERCOCET/ROXICET) 5-325 MG TABLET    Take 2 tablets by mouth every 6 (six) hours as needed for severe pain.   POTASSIUM CHLORIDE  (K-DUR) 10 MEQ TABLET    Take 1 tablet (10 mEq total) by mouth 2 (two) times daily for 10 days.   PROPRANOLOL  (INDERAL ) 40 MG TABLET    Take 1 tablet (40 mg total) by mouth 3 (three) times daily for 30 days.   TIZANIDINE  (ZANAFLEX ) 4 MG CAPSULE    Take 1 capsule (4 mg total) by mouth 3 (three) times daily as needed for muscle spasms.  Modified Medications   No medications on file  Discontinued Medications   No medications on file    Labs: Hepatitis C Lab Results  Component Value Date   HCVGENOTYPE 1b 10/10/2023   HCVRNAPCRQN <15 DETECTED (A) 01/07/2024   Hepatitis B No results found for: HEPBSAB, HEPBSAG, HEPBCAB Hepatitis A No results found for: HAV HIV Lab Results  Component Value Date   HIV NON-REACTIVE 10/10/2023   Lab Results  Component Value Date   CREATININE 1.79 (H) 10/10/2023   CREATININE 1.36 (H) 08/12/2023   CREATININE 1.11 08/18/2018   CREATININE 1.19 08/10/2018   CREATININE 1.09 08/09/2018   Lab Results  Component Value Date   AST 29  10/10/2023   AST 40 08/18/2018   AST 47 (H) 08/10/2018   ALT 35 10/10/2023   ALT 33 10/10/2023   ALT 41 08/18/2018   INR 1.1 10/10/2023    Assessment: Brian Foster is here today for his end of therapy visit for Hepatitis C. He completed 8 weeks of Mavyret  last week with no issues and no missed doses. His early on treatment viral load was already undetectable last month. No issues and no concerns. Will check labs again today and have him see Cathlyn for his cure visit in 3 months.    Plan: - Hepatitis C RNA - Follow up with Cathlyn on 05/11/24  Paymon Rosensteel L. Jaylea Plourde, PharmD, BCIDP, AAHIVP, CPP Clinical Pharmacist Practitioner - Infectious Diseases Clinical Pharmacist Lead - [facility] for [facility]

## 2024-02-05 LAB — HEPATITIS C RNA QUANTITATIVE
HCV Quantitative Log: <1.18 {Log_IU}/mL
HCV RNA, PCR, QN: <15 [IU]/mL

## 2024-05-11 ENCOUNTER — Ambulatory Visit: Payer: MEDICAID | Admitting: Family
